# Patient Record
Sex: Female | Born: 1965 | Race: White | Hispanic: No | Smoking: Never smoker
Health system: Southern US, Community
[De-identification: ages and names within clinical notes are randomized; demographics above are authoritative.]

## PROBLEM LIST (undated history)

## (undated) DIAGNOSIS — F329 Major depressive disorder, single episode, unspecified: Secondary | ICD-10-CM

## (undated) DIAGNOSIS — F419 Anxiety disorder, unspecified: Secondary | ICD-10-CM

## (undated) DIAGNOSIS — R739 Hyperglycemia, unspecified: Secondary | ICD-10-CM

## (undated) DIAGNOSIS — R319 Hematuria, unspecified: Secondary | ICD-10-CM

## (undated) DIAGNOSIS — E119 Type 2 diabetes mellitus without complications: Secondary | ICD-10-CM

## (undated) DIAGNOSIS — L719 Rosacea, unspecified: Secondary | ICD-10-CM

## (undated) DIAGNOSIS — E559 Vitamin D deficiency, unspecified: Secondary | ICD-10-CM

## (undated) DIAGNOSIS — F32A Depression, unspecified: Secondary | ICD-10-CM

## (undated) DIAGNOSIS — F418 Other specified anxiety disorders: Secondary | ICD-10-CM

## (undated) DIAGNOSIS — E669 Obesity, unspecified: Secondary | ICD-10-CM

## (undated) DIAGNOSIS — E782 Mixed hyperlipidemia: Secondary | ICD-10-CM

## (undated) DIAGNOSIS — E538 Deficiency of other specified B group vitamins: Secondary | ICD-10-CM

## (undated) HISTORY — DX: Deficiency of other specified B group vitamins: E53.8

## (undated) HISTORY — DX: Depression, unspecified: F32.A

## (undated) HISTORY — DX: Anxiety disorder, unspecified: F41.9

## (undated) HISTORY — DX: Vitamin D deficiency, unspecified: E55.9

## (undated) HISTORY — PX: WISDOM TOOTH EXTRACTION: SHX21

## (undated) HISTORY — DX: Hyperglycemia, unspecified: R73.9

## (undated) HISTORY — DX: Rosacea, unspecified: L71.9

## (undated) HISTORY — DX: Hematuria, unspecified: R31.9

## (undated) HISTORY — DX: Mixed hyperlipidemia: E78.2

## (undated) HISTORY — DX: Obesity, unspecified: E66.9

## (undated) HISTORY — DX: Other specified anxiety disorders: F41.8

## (undated) HISTORY — DX: Major depressive disorder, single episode, unspecified: F32.9

---

## 1997-08-10 ENCOUNTER — Other Ambulatory Visit: Admission: RE | Admit: 1997-08-10 | Discharge: 1997-08-10 | Payer: Self-pay | Admitting: Gynecology

## 1997-09-04 ENCOUNTER — Other Ambulatory Visit: Admission: RE | Admit: 1997-09-04 | Discharge: 1997-09-04 | Payer: Self-pay | Admitting: Gynecology

## 1998-02-05 ENCOUNTER — Encounter: Admission: RE | Admit: 1998-02-05 | Discharge: 1998-05-06 | Payer: Self-pay | Admitting: Gynecology

## 1998-04-02 ENCOUNTER — Inpatient Hospital Stay (HOSPITAL_COMMUNITY): Admission: AD | Admit: 1998-04-02 | Discharge: 1998-04-05 | Payer: Self-pay | Admitting: Gynecology

## 1998-05-08 ENCOUNTER — Other Ambulatory Visit: Admission: RE | Admit: 1998-05-08 | Discharge: 1998-05-08 | Payer: Self-pay | Admitting: Obstetrics and Gynecology

## 1999-05-27 ENCOUNTER — Other Ambulatory Visit: Admission: RE | Admit: 1999-05-27 | Discharge: 1999-05-27 | Payer: Self-pay | Admitting: Obstetrics and Gynecology

## 2000-06-01 ENCOUNTER — Other Ambulatory Visit: Admission: RE | Admit: 2000-06-01 | Discharge: 2000-06-01 | Payer: Self-pay | Admitting: Gynecology

## 2001-06-01 ENCOUNTER — Other Ambulatory Visit: Admission: RE | Admit: 2001-06-01 | Discharge: 2001-06-01 | Payer: Self-pay | Admitting: Gynecology

## 2002-06-02 ENCOUNTER — Other Ambulatory Visit: Admission: RE | Admit: 2002-06-02 | Discharge: 2002-06-02 | Payer: Self-pay | Admitting: Gynecology

## 2003-09-17 ENCOUNTER — Other Ambulatory Visit: Admission: RE | Admit: 2003-09-17 | Discharge: 2003-09-17 | Payer: Self-pay | Admitting: Gynecology

## 2004-11-13 ENCOUNTER — Other Ambulatory Visit: Admission: RE | Admit: 2004-11-13 | Discharge: 2004-11-13 | Payer: Self-pay | Admitting: Gynecology

## 2005-12-10 ENCOUNTER — Other Ambulatory Visit: Admission: RE | Admit: 2005-12-10 | Discharge: 2005-12-10 | Payer: Self-pay | Admitting: Gynecology

## 2008-02-21 ENCOUNTER — Other Ambulatory Visit: Admission: RE | Admit: 2008-02-21 | Discharge: 2008-02-21 | Payer: Self-pay | Admitting: Obstetrics and Gynecology

## 2011-01-29 HISTORY — PX: DENTAL SURGERY: SHX609

## 2011-04-30 LAB — HM PAP SMEAR: HM Pap smear: NEGATIVE

## 2011-09-16 ENCOUNTER — Ambulatory Visit (INDEPENDENT_AMBULATORY_CARE_PROVIDER_SITE_OTHER): Payer: 59 | Admitting: Family Medicine

## 2011-09-16 VITALS — BP 125/80 | HR 108 | Temp 99.1°F | Resp 16 | Ht 67.5 in | Wt 195.0 lb

## 2011-09-16 DIAGNOSIS — W5503XA Scratched by cat, initial encounter: Secondary | ICD-10-CM

## 2011-09-16 DIAGNOSIS — Z23 Encounter for immunization: Secondary | ICD-10-CM

## 2011-09-16 DIAGNOSIS — J029 Acute pharyngitis, unspecified: Secondary | ICD-10-CM

## 2011-09-16 LAB — POCT RAPID STREP A (OFFICE): Rapid Strep A Screen: POSITIVE — AB

## 2011-09-16 MED ORDER — AMOXICILLIN-POT CLAVULANATE 875-125 MG PO TABS: 1.0000 | ORAL_TABLET | Freq: Two times a day (BID) | ORAL | Status: AC

## 2011-09-16 NOTE — Progress Notes (Signed)
Patient Name: REANA CHACKO Date of Birth: 1965/11/04 Medical Record Number: [id number] Gender: female Date of Encounter: 09/16/2011  History of Present Illness:  COZETTA SEIF is a 46 y.o. very pleasant female patient who presents with the following:  She has been ill for about 5 days.  Her illness started with a ST- this seemed to get better and she then had some cough.  Then over the last few days the cough got better but her ST got even worse.  She has not noted a fever at home.  Tylenol is not helping.  No aches or fatigue except for normal fatigue.    She also was scratched by a cat over the weekend- she then developed some redness and tenderness at the scratch sites.  They seem hot as well.  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  Her rabies shot had expired and she was going to take her to get her updated shot.  She was putting her into the carrier when the car got loose and scratched her arm.  This incident occurred on Saturday 6/15. She is really not sure when the animal's last rabies shot was.   otherwise generally healthy, LMP yesterday  Unsure last tetanus.   There is no problem list on file for this patient.  No past medical history on file. No past surgical history on file. History  Substance Use Topics  . Smoking status: Never Smoker   . Smokeless tobacco: Not on file  . Alcohol Use: Not on file   No family history on file. No Known Allergies  Medication list has been reviewed and updated.  Prior to Admission medications   Medication Sig Start Date End Date Taking? Authorizing Provider  Norethindrone Acetate-Ethinyl Estradiol (LOESTRIN 1.5/30, 21,) 1.5-30 MG-MCG tablet Take 1 tablet by mouth daily.   Yes Historical Provider, MD    Review of Systems:  As per HPI- otherwise negative.   Physical Examination: Filed Vitals:   09/16/11 1446  BP: 125/80  Pulse: 108  Temp: 99.1 F (37.3 C)  Resp: 16   Filed Vitals:   09/16/11 1446  Height:  5' 7.5" (1.715 m)  Weight: 195 lb (88.451 kg)   Body mass index is 30.09 kg/(m^2). Ideal Body Weight: Weight in (lb) to have BMI = 25: 161.7   GEN: WDWN, NAD, Non-toxic, A & O x 3 HEENT: Atraumatic, Normocephalic. Neck supple. No masses, No LAD.  PEERL, TM wnl, oropharynx injected but no exudate Ears and Nose: No external deformity. CV: RRR, No M/G/R. No JVD. No thrill. No extra heart sounds. PULM: CTA B, no wheezes, crackles, rhonchi. No retractions. No resp. distress. No accessory muscle use. EXTR: No c/c/e NEURO Normal gait.  PSYCH: Normally interactive. Conversant. Not depressed or anxious appearing.  Calm demeanor.  Right forearm:  3 small wounds which are warm and slightly swollen/ red- appear to be getting infected.  Normal function of hand  Results for orders placed in visit on 09/16/11  POCT RAPID STREP A (OFFICE)      Component Value Range   Rapid Strep A Screen Positive (*) Negative    Assessment and Plan: 1. Sore throat  POCT rapid strep A, amoxicillin-clavulanate (AUGMENTIN) 875-125 MG per tablet  2. Need for diphtheria-tetanus-pertussis (Tdap) vaccine, adult/adolescent  Tdap vaccine greater than or equal to 7yo IM  3. Cat scratch     Treat for infected cat wound and strep with augmentin.  Called CDC and then infectious disease  control in [place], Database administrator. He recommended going ahead with potential bite report- as rabies status is unknown the animal needs to be observed. It can be hard to tell a bite from a scratch in this situation.  While we understand that Minha does not her cat to be taken away and observed at animal control unnecessarily, we need to act in her best interests and be sure there is no rabies risk.  Watch for worsening infection closely, no work for 24 hours, let me know if throat not better in one or two days    Zyriah Mask, MD

## 2011-11-26 LAB — HM MAMMOGRAPHY: HM Mammogram: NEGATIVE

## 2012-10-18 ENCOUNTER — Telehealth: Payer: Self-pay | Admitting: Nurse Practitioner

## 2012-10-18 NOTE — Telephone Encounter (Signed)
Since there is no generic for LO Loestrin , I wonder if this is the one she did the best on - not the generic Loestsrin.  I would rather give her 3 months of samples to take and calm down the menorrhagia and dysmenorrhea then report back as to how she did on this.  Can you call and get samples for her.

## 2012-10-18 NOTE — Telephone Encounter (Signed)
Spoke with pt about samples of lo loestrin to regulate her bleeding. Pt to try for 3 months and call back with update. Lot # [id number] A. exp date 11-2013. 3 boxes given. Pt to come by tomorrow and pick up at front desk.

## 2012-10-18 NOTE — Telephone Encounter (Signed)
Patient call in to say that the Lo-lo-Estrin (generic) is not working as well as the name brand. Please advise patient. Still having cramps.

## 2012-10-18 NOTE — Telephone Encounter (Signed)
Spoke with pt about symptoms. Pt was in bed with bad cramping Sunday morning. Pt was having dry heaves and could hardly keep anything down, but she took ibuprofen with good relief. Started period yesterday. Having clots yesterday and today. Pt feels she did better on the brand name samples PG gave her than she is doing now on the generic. Pt reports her period was lighter and she did not have the cramps on the brand drug. Pt really does not want to pay $75 a month for the brand drug. Pt wondering if she should wait another month and see if it will improve with next period? Please advise.

## 2012-12-28 ENCOUNTER — Telehealth: Payer: Self-pay | Admitting: Nurse Practitioner

## 2012-12-28 NOTE — Telephone Encounter (Signed)
Pt called to request refill of the Loloestrin sent to [facility] on [address]. Their number is [phone].

## 2012-12-29 NOTE — Telephone Encounter (Signed)
Spoke with patient. She would like to stay on the loLoestrin despite cost.  She has savings cards she would like to use.  Routing refill request to Lauro Franklin, FNP

## 2013-01-02 ENCOUNTER — Encounter: Payer: Self-pay | Admitting: Family Medicine

## 2013-01-02 ENCOUNTER — Telehealth: Payer: Self-pay | Admitting: Emergency Medicine

## 2013-01-02 ENCOUNTER — Ambulatory Visit (INDEPENDENT_AMBULATORY_CARE_PROVIDER_SITE_OTHER): Payer: PRIVATE HEALTH INSURANCE | Admitting: Family Medicine

## 2013-01-02 VITALS — BP 127/74 | HR 95 | Temp 99.0°F | Resp 18 | Ht 67.0 in | Wt 203.6 lb

## 2013-01-02 DIAGNOSIS — F329 Major depressive disorder, single episode, unspecified: Secondary | ICD-10-CM

## 2013-01-02 DIAGNOSIS — F32A Depression, unspecified: Secondary | ICD-10-CM

## 2013-01-02 DIAGNOSIS — Z0000 Encounter for general adult medical examination without abnormal findings: Secondary | ICD-10-CM

## 2013-01-02 LAB — POCT URINALYSIS DIPSTICK
Bilirubin, UA: NEGATIVE
Glucose, UA: NEGATIVE
Ketones, UA: NEGATIVE
Nitrite, UA: NEGATIVE
Protein, UA: NEGATIVE
Spec Grav, UA: 1.02
Urobilinogen, UA: 0.2
pH, UA: 5.5

## 2013-01-02 LAB — COMPREHENSIVE METABOLIC PANEL
ALT: 15 U/L (ref 0–35)
AST: 14 U/L (ref 0–37)
Albumin: 4.2 g/dL (ref 3.5–5.2)
Alkaline Phosphatase: 61 U/L (ref 39–117)
BUN: 12 mg/dL (ref 6–23)
CO2: 24 mEq/L (ref 19–32)
Calcium: 8.9 mg/dL (ref 8.4–10.5)
Chloride: 104 mEq/L (ref 96–112)
Creat: 0.67 mg/dL (ref 0.50–1.10)
Glucose, Bld: 98 mg/dL (ref 70–99)
Potassium: 4.7 mEq/L (ref 3.5–5.3)
Sodium: 135 mEq/L (ref 135–145)
Total Bilirubin: 0.5 mg/dL (ref 0.3–1.2)
Total Protein: 6.9 g/dL (ref 6.0–8.3)

## 2013-01-02 LAB — LIPID PANEL
Cholesterol: 162 mg/dL (ref 0–200)
HDL: 52 mg/dL (ref >39)
LDL Cholesterol: 91 mg/dL (ref 0–99)
Total CHOL/HDL Ratio: 3.1 Ratio
Triglycerides: 96 mg/dL (ref <150)
VLDL: 19 mg/dL (ref 0–40)

## 2013-01-02 LAB — CBC WITH DIFFERENTIAL/PLATELET
Basophils Absolute: 0 10*3/uL (ref 0.0–0.1)
Basophils Relative: 0 % (ref 0–1)
Eosinophils Absolute: 0.2 10*3/uL (ref 0.0–0.7)
Eosinophils Relative: 3 % (ref 0–5)
HCT: 41.2 % (ref 36.0–46.0)
Hemoglobin: 13.9 g/dL (ref 12.0–15.0)
Lymphocytes Relative: 22 % (ref 12–46)
Lymphs Abs: 1.6 10*3/uL (ref 0.7–4.0)
MCH: 29.3 pg (ref 26.0–34.0)
MCHC: 33.7 g/dL (ref 30.0–36.0)
MCV: 86.9 fL (ref 78.0–100.0)
Monocytes Absolute: 0.4 10*3/uL (ref 0.1–1.0)
Monocytes Relative: 5 % (ref 3–12)
Neutro Abs: 5.1 10*3/uL (ref 1.7–7.7)
Neutrophils Relative %: 70 % (ref 43–77)
Platelets: 442 10*3/uL — ABNORMAL HIGH (ref 150–400)
RBC: 4.74 MIL/uL (ref 3.87–5.11)
RDW: 13.3 % (ref 11.5–15.5)
WBC: 7.4 10*3/uL (ref 4.0–10.5)

## 2013-01-02 LAB — POCT UA - MICROSCOPIC ONLY
Casts, Ur, LPF, POC: NEGATIVE
Crystals, Ur, HPF, POC: NEGATIVE
Mucus, UA: NEGATIVE
Yeast, UA: NEGATIVE

## 2013-01-02 LAB — TSH: TSH: 3.085 u[IU]/mL (ref 0.350–4.500)

## 2013-01-02 LAB — HEMOGLOBIN A1C
Hgb A1c MFr Bld: 6.2 % — ABNORMAL HIGH (ref <5.7)
Mean Plasma Glucose: 131 mg/dL — ABNORMAL HIGH (ref <117)

## 2013-01-02 LAB — VITAMIN B12: Vitamin B-12: 290 pg/mL (ref 211–911)

## 2013-01-02 MED ORDER — NORETHIN-ETH ESTRAD-FE BIPHAS 1 MG-10 MCG / 10 MCG PO TABS: 1.0000 | ORAL_TABLET | Freq: Every day | ORAL | Status: DC

## 2013-01-02 MED ORDER — BUPROPION HCL ER (XL) 150 MG PO TB24: 150.0000 mg | ORAL_TABLET | Freq: Every day | ORAL | Status: DC

## 2013-01-02 MED ORDER — SERTRALINE HCL 50 MG PO TABS: 50.0000 mg | ORAL_TABLET | Freq: Every day | ORAL | Status: DC

## 2013-01-02 NOTE — Telephone Encounter (Signed)
Encounter created in Error

## 2013-01-02 NOTE — Telephone Encounter (Signed)
Medication ordered and patient notified.  

## 2013-01-02 NOTE — Telephone Encounter (Signed)
Ok with me if she stays on Lo Loestrin instead of Loestrin - can have a refill through 05/2013

## 2013-01-02 NOTE — Progress Notes (Signed)
Subjective:    Patient ID: Cynthia Walters, female    DOB: 1965/12/31, 47 y.o.   MRN: [id number]  HPI Last CPE- 2010 Pap- 2/14, normal, Dr. Sable Feil- 7/13, normal Colonoscopy- never Tdap-08/2011 Flu- will get at work Eye exam- 4 years ago; uses reading glasses Dentist- every 6 months; no issues  PMH- Depression- has been treated in the past with medication; had trouble sleeping- took Ambien for 1 year. Felt dependent. Took Remeron- had 40 pound weight gain. Has tried Wellbutrin, Zoloft (doesnt' recall side effects), Prozac, adderall. Wellbutrin worked for [employer] but couldn't sleep. Hasn't had any meds for depression since 10/11. Open to taking Wellbutrin/Zoloft.  2006- weighed 170 pounds. Was having marital problems. Exercised and lost 30 pounds. 2011- Weighed 140 pounds.  Was in depression study 2012. Was told she needed medication.  Menorrhagia- takes LoLoestrin FE  Rosasea- no meds  FH- Mother- 17, alive, underactive thyroid Father- 62, alive, depression- no medication Brothers x 2- ?depression (no breast or colon cancer in family)  SH- Divorced, lives with 83 year old son and her boyfriend. Dating for 5 years. No abuse. Accountant. At same firm for 17 years. Feels like depression gets in the way. No tobacco. Beer/wine occasionally Not currently exercising Wears seatbelt Uses sunscreen when outside  Depression- withdrawn, wants to stay in bed, doesn't have contact with family and friends. Denies suicidal ideation.   Review of Systems Hearing ok, no ringing in ears, no headaches, no blurred vision/double vision No mouth sores that won't heal. No FH seizures. No neck swelling. Right lymph node swollen last week, gone now.  Some seasonal allergies. No CP, no palpitations, no SOB, no cough +snores, doesn't know if she quits breathing during night. Awakens feeling fatigued. Falls asleep easily in the afternoon on the weekends; very tired at work in the  afternoon. Falls asleep easily, sometimes awakens at 3-4 am and has difficulty falling back to sleep.Sleeps about 7 hours. No neck pain, no shoulder pain, no numbness or tingling in arms or legs. +back pain with sleeping some times. +small amt. Swelling Bowel movements regular, no bloody/black stools, nocturia x 1, no heart burn.     Past Medical History  Diagnosis Date  . Anxiety   . Depression   . Rosacea    Past Surgical History  Procedure Laterality Date  . Cesarean section     No Known Allergies Current Outpatient Prescriptions on File Prior to Visit  Medication Sig Dispense Refill  . Norethindrone-Ethinyl Estradiol-Fe Biphas (LO LOESTRIN FE) 1 MG-10 MCG / 10 MCG tablet Take 1 tablet by mouth daily.  1 Package  6  . Norethindrone Acetate-Ethinyl Estradiol (LOESTRIN 1.5/30, 21,) 1.5-30 MG-MCG tablet Take 1 tablet by mouth daily.       No current facility-administered medications on file prior to visit.   History   Social History  . Marital Status: Single    Spouse Name: N/A    Number of Children: 1  . Years of Education: N/A   Occupational History  . accountant    Social History Main Topics  . Smoking status: Never Smoker   . Smokeless tobacco: Not on file  . Alcohol Use: 1.2 oz/week    2 Glasses of wine per week  . Drug Use: No  . Sexual Activity: Yes    Birth Control/ Protection: Pill   Other Topics Concern  . Not on file   Social History Narrative   Marital status:  Divorced, Dating x 5 years; no abuse.  Children: one son (42 yo)      lives with:   53 year old son and her boyfriend.      Employment:  Airline pilot. At same firm for 17 years. Feels like depression gets in the way.      Tobacco:  No tobacco.      Alcohol:  Beer/wine occasionally      Exercise:  Not currently exercising   Family History  Problem Relation Age of Onset  . Thyroid disease Mother   . Depression Father   . Heart disease Maternal Grandmother   . Stroke Paternal Grandmother    . Stroke Paternal Grandfather     Objective:   Physical Exam  Nursing note and vitals reviewed. Constitutional: She is oriented to person, place, and time. She appears well-developed and well-nourished. No distress.  HENT:  Head: Normocephalic and atraumatic.  Right Ear: External ear normal.  Left Ear: External ear normal.  Nose: Nose normal.  Mouth/Throat: Oropharynx is clear and moist.  Eyes: Conjunctivae and EOM are normal. Pupils are equal, round, and reactive to light.  Neck: Normal range of motion and full passive range of motion without pain. Neck supple. No JVD present. Carotid bruit is not present. No thyromegaly present.  Cardiovascular: Normal rate, regular rhythm and normal heart sounds.  Exam reveals no gallop and no friction rub.   No murmur heard. Pulmonary/Chest: Effort normal and breath sounds normal. She has no wheezes. She has no rales.  Abdominal: Soft. Bowel sounds are normal. She exhibits no distension and no mass. There is no tenderness. There is no rebound and no guarding.  Musculoskeletal:       Right shoulder: Normal.       Left shoulder: Normal.       Cervical back: Normal.  Lymphadenopathy:    She has no cervical adenopathy.  Neurological: She is alert and oriented to person, place, and time. She has normal reflexes. No cranial nerve deficit. She exhibits normal muscle tone. Coordination normal.  Skin: Skin is warm and dry. No rash noted. She is not diaphoretic. No erythema. No pallor.  Psychiatric: She has a normal mood and affect. Her behavior is normal. Judgment and thought content normal.       Assessment & Plan:  Routine general medical examination at a health care facility - Plan: CBC with Differential, Comprehensive metabolic panel, Hemoglobin A1c, Lipid panel, TSH, Vitamin B12, Vit D  25 hydroxy (rtn osteoporosis monitoring), POCT urinalysis dipstick, POCT UA - Microscopic Only  Depression   1. CPE: anticipatory guidance --- weight loss,  exercise.  Pap smear and mammogram per gynecology.  No previous colonoscopy.  Immunizations UTD. Obtain labs. 2. Depression:uncontrolled; rx for Zoloft 50mg  daily and Wellbutrin XL 150mg  daily.  Follow-up in 2-3 months.  Meds ordered this encounter  Medications  . sertraline (ZOLOFT) 50 MG tablet    Sig: Take 1 tablet (50 mg total) by mouth daily.    Dispense:  30 tablet    Refill:  5  . DISCONTD: buPROPion (WELLBUTRIN XL) 150 MG 24 hr tablet    Sig: Take 1 tablet (150 mg total) by mouth daily.    Dispense:  30 tablet    Refill:  5   Nilda Simmer, M.D.  [facility] [address] [place], [place]  [address] [phone] phone [phone] fax

## 2013-01-03 LAB — VITAMIN D 25 HYDROXY (VIT D DEFICIENCY, FRACTURES): Vit D, 25-Hydroxy: 31 ng/mL (ref 30–89)

## 2013-02-01 ENCOUNTER — Encounter: Payer: Self-pay | Admitting: Family Medicine

## 2013-02-02 ENCOUNTER — Other Ambulatory Visit: Payer: Self-pay

## 2013-02-06 MED ORDER — ESCITALOPRAM OXALATE 10 MG PO TABS: 10.0000 mg | ORAL_TABLET | Freq: Every day | ORAL | Status: DC

## 2013-02-28 ENCOUNTER — Encounter: Payer: Self-pay | Admitting: Family Medicine

## 2013-03-07 ENCOUNTER — Ambulatory Visit: Payer: PRIVATE HEALTH INSURANCE | Admitting: Family Medicine

## 2013-03-15 ENCOUNTER — Encounter: Payer: Self-pay | Admitting: Family Medicine

## 2013-03-15 ENCOUNTER — Telehealth: Payer: Self-pay | Admitting: Family Medicine

## 2013-03-15 ENCOUNTER — Ambulatory Visit (INDEPENDENT_AMBULATORY_CARE_PROVIDER_SITE_OTHER): Payer: PRIVATE HEALTH INSURANCE | Admitting: Family Medicine

## 2013-03-15 VITALS — BP 125/73 | HR 97 | Temp 98.4°F | Resp 16 | Ht 67.0 in | Wt 202.0 lb

## 2013-03-15 DIAGNOSIS — F341 Dysthymic disorder: Secondary | ICD-10-CM

## 2013-03-15 DIAGNOSIS — G5600 Carpal tunnel syndrome, unspecified upper limb: Secondary | ICD-10-CM

## 2013-03-15 DIAGNOSIS — R7309 Other abnormal glucose: Secondary | ICD-10-CM

## 2013-03-15 DIAGNOSIS — F32A Depression, unspecified: Secondary | ICD-10-CM

## 2013-03-15 DIAGNOSIS — F329 Major depressive disorder, single episode, unspecified: Secondary | ICD-10-CM

## 2013-03-15 DIAGNOSIS — G4700 Insomnia, unspecified: Secondary | ICD-10-CM

## 2013-03-15 DIAGNOSIS — R7302 Impaired glucose tolerance (oral): Secondary | ICD-10-CM

## 2013-03-15 DIAGNOSIS — G5602 Carpal tunnel syndrome, left upper limb: Secondary | ICD-10-CM

## 2013-03-15 MED ORDER — BUPROPION HCL ER (XL) 300 MG PO TB24: 300.0000 mg | ORAL_TABLET | Freq: Every day | ORAL | Status: DC

## 2013-03-15 NOTE — Patient Instructions (Signed)

## 2013-03-15 NOTE — Telephone Encounter (Signed)
Called patient to see if she could come into appt. Center at 3:30 because we had a patient call out

## 2013-03-15 NOTE — Progress Notes (Signed)
Subjective:    Patient ID: Cynthia Walters, female    DOB: May 14, 1965, 47 y.o.   MRN: [id number]  HPI This 47 y.o. female presents for two month follow-up of the following:  1. Depression: at visit 01/02/13, rx for Zoloft 50mg  and Wellbutrin XL 150mg  daily provided.  Pt suffered from side effects from Zoloft so was switched to Lexapro 10mg  last week.   Zquil qhs.  Waking up at 3 or 4am and unable to return to sleep. Wellbutrin XL 150mg  q am.   Lexapro 10mg  q am. Mondays suffer with bad hypersomnolence.  Chronic issue.  Snores; no apnea.  Naps on weekends. Emotionally feel better;  had neighborhood cookout, did not dread attending event. Heidi was walking with patient for a while; none in two weeks.  45 minutes per session.    2.  Glucose Intolerance:  Normal fasting sugars but recent HgbA1c of 6.2.    3. L hand numbness:  Thumb and radiates to elbow.   No neck pain.  No weakness.     Review of Systems  Constitutional: Positive for fatigue. Negative for chills, diaphoresis, activity change and appetite change.  Endocrine: Negative for polydipsia, polyphagia and polyuria.  Musculoskeletal: Positive for arthralgias. Negative for back pain, joint swelling, neck pain and neck stiffness.  Neurological: Positive for numbness.  Psychiatric/Behavioral: Positive for sleep disturbance and dysphoric mood. Negative for suicidal ideas and self-injury. The patient is nervous/anxious.        Objective:   Physical Exam  Constitutional: She is oriented to person, place, and time. She appears well-developed and well-nourished. No distress.  HENT:  Head: Normocephalic and atraumatic.  Right Ear: External ear normal.  Left Ear: External ear normal.  Nose: Nose normal.  Mouth/Throat: Oropharynx is clear and moist.  Eyes: Conjunctivae and EOM are normal. Pupils are equal, round, and reactive to light.  Neck: Normal range of motion. Neck supple. Carotid bruit is not present. No thyromegaly present.    Cardiovascular: Normal rate, regular rhythm, normal heart sounds and intact distal pulses.  Exam reveals no gallop and no friction rub.   No murmur heard. Pulmonary/Chest: Effort normal and breath sounds normal. She has no wheezes. She has no rales.  Abdominal: Soft. Bowel sounds are normal. She exhibits no distension and no mass. There is no tenderness. There is no rebound and no guarding.  Musculoskeletal:       Right shoulder: Normal.       Left shoulder: Normal.       Right elbow: Normal.      Left elbow: Normal.       Right wrist: Normal.       Left wrist: Normal.       Cervical back: Normal. She exhibits normal range of motion.  Lymphadenopathy:    She has no cervical adenopathy.  Neurological: She is alert and oriented to person, place, and time. No cranial nerve deficit.  Skin: Skin is warm and dry. No rash noted. She is not diaphoretic. No erythema. No pallor.  Psychiatric: She has a normal mood and affect. Her behavior is normal.       Assessment & Plan:  Anxiety and depression  Insomnia  Glucose intolerance (impaired glucose tolerance)  Carpal tunnel syndrome, left  1. Anxiety with depression: slightly improved; increase Wellbutrin to 300mg  daily due to hypersomnolence.  Continue Lexapro 10mg  daily.   2.  Insomnia: uncontrolled; treat anxiety and depression appropriately and expect insomnia to improve; recommend regular exercise and caffeine  avoidance after lunch. 3.  Glucose intolerance: New. Recommend weight loss, exercise, low-carbohydrate food choices. 4.  Carpal Tunnel syndrome L: New. Recommend wrist splint qhs; recommend scheduled NSAID for two weeks and then PRN. Consider B6 supplement.  Meds ordered this encounter  Medications  . DISCONTD: buPROPion (WELLBUTRIN XL) 300 MG 24 hr tablet    Sig: Take 1 tablet (300 mg total) by mouth daily.    Dispense:  30 tablet    Refill:  5   Nilda Simmer, M.D.  [facility] [address] [place], [place]  [address] [phone] phone [phone] fax

## 2013-05-31 ENCOUNTER — Encounter: Payer: Self-pay | Admitting: Nurse Practitioner

## 2013-06-01 ENCOUNTER — Ambulatory Visit: Payer: 59 | Admitting: Nurse Practitioner

## 2013-06-08 ENCOUNTER — Encounter: Payer: Self-pay | Admitting: Family Medicine

## 2013-06-27 ENCOUNTER — Ambulatory Visit (INDEPENDENT_AMBULATORY_CARE_PROVIDER_SITE_OTHER): Payer: PRIVATE HEALTH INSURANCE | Admitting: Nurse Practitioner

## 2013-06-27 ENCOUNTER — Encounter: Payer: Self-pay | Admitting: Nurse Practitioner

## 2013-06-27 VITALS — BP 130/76 | HR 88 | Ht 67.25 in | Wt 203.0 lb

## 2013-06-27 DIAGNOSIS — Z01419 Encounter for gynecological examination (general) (routine) without abnormal findings: Secondary | ICD-10-CM

## 2013-06-27 MED ORDER — NORETHIN-ETH ESTRAD-FE BIPHAS 1 MG-10 MCG / 10 MCG PO TABS: 1.0000 | ORAL_TABLET | Freq: Every day | ORAL | Status: DC

## 2013-06-27 NOTE — Patient Instructions (Signed)

## 2013-06-27 NOTE — Progress Notes (Signed)
Patient ID: Cynthia Walters, female   DOB: [birth year], 48 y.o.   MRN: [id number] 48 y.o. G1P1001 Single Caucasian Fe here for annual exam.  Still on Lo Loestrin and doing well.  Menses at 1-2 days of light spotting or no menses. (On Loestrin her menses was still heavy).  Maybe slight cramps. Same partner for 6 years.  She doing well on Lexapro and Wellbutrin since August.  There have been an increase in vaso symptoms since starting on Lexapro.  She will discuss this with PCP.   Patient's last menstrual period was 06/26/2013.          Sexually active: yes  The current method of family planning is vasectomy and OCP (estrogen/progesterone).    Exercising: no  The patient does not participate in regular exercise at present. Smoker:  no  Health Maintenance: Pap:  04/30/11, WNL, neg HR HPV MMG:  11/26/11, Bi-Rads 1: negative TDaP:  08/2011 Labs:  HB: declined, PCP Urine:  Declined, PCP   reports that she has never smoked. She has never used smokeless tobacco. She reports that she drinks about 2.4 ounces of alcohol per week. She reports that she does not use illicit drugs.  Past Medical History  Diagnosis Date  . Anxiety   . Depression   . Rosacea     Past Surgical History  Procedure Laterality Date  . Cesarean section  2000  . Wisdom tooth extraction  age 54    Current Outpatient Prescriptions  Medication Sig Dispense Refill  . buPROPion (WELLBUTRIN XL) 300 MG 24 hr tablet Take 1 tablet (300 mg total) by mouth daily.  30 tablet  5  . escitalopram (LEXAPRO) 10 MG tablet Take 1 tablet (10 mg total) by mouth daily.  30 tablet  5  . Norethindrone-Ethinyl Estradiol-Fe Biphas (LO LOESTRIN FE) 1 MG-10 MCG / 10 MCG tablet Take 1 tablet by mouth daily.  3 Package  3   No current facility-administered medications for this visit.    Family History  Problem Relation Age of Onset  . Thyroid disease Mother   . Depression Father   . Heart disease Maternal Grandmother   . Stroke Paternal  Grandmother   . Stroke Paternal Grandfather   . Depression Brother   . Depression Brother     ROS:  Pertinent items are noted in HPI.  Otherwise, a comprehensive ROS was negative.  Exam:   BP 130/76  Pulse 88  Ht 5' 7.25" (1.708 m)  Wt 203 lb (92.08 kg)  BMI 31.56 kg/m2  LMP 06/26/2013 Height: 5' 7.25" (170.8 cm)  Ht Readings from Last 3 Encounters:  06/27/13 5' 7.25" (1.708 m)  03/15/13 5\' 7"  (1.702 m)  01/02/13 5\' 7"  (1.702 m)    General appearance: alert, cooperative and appears stated age Head: Normocephalic, without obvious abnormality, atraumatic Neck: no adenopathy, supple, symmetrical, trachea midline and thyroid normal to inspection and palpation Lungs: clear to auscultation bilaterally Breasts: normal appearance, no masses or tenderness Heart: regular rate and rhythm Abdomen: soft, non-tender; no masses,  no organomegaly Extremities: extremities normal, atraumatic, no cyanosis or edema Skin: Skin color, texture, turgor normal. No rashes or lesions Lymph nodes: Cervical, supraclavicular, and axillary nodes normal. No abnormal inguinal nodes palpated Neurologic: Grossly normal   Pelvic: External genitalia:  no lesions              Urethra:  normal appearing urethra with no masses, tenderness or lesions  Bartholin's and Skene's: normal                 Vagina: normal appearing vagina with normal color and discharge, no lesions              Cervix: anteverted              Pap taken: no Bimanual Exam:  Uterus:  normal size, contour, position, consistency, mobility, non-tender              Adnexa: no mass, fullness, tenderness               Rectovaginal: Confirms               Anus:  normal sphincter tone, no lesions  A:  Well Woman with normal exam  Vasectomy for birth control  OCP for menorrhagia  P:   Pap smear as per guidelines   Mammogram is due now and will schedule  Refill Lo Loestrin for a year  Counseled on breast self exam, mammography  screening, use and side effects of OCP's, adequate intake of calcium and vitamin D, diet and exercise return annually or prn  An After Visit Summary was printed and given to the patient.

## 2013-06-27 NOTE — Progress Notes (Signed)
Reviewed personally.  M. Suzanne Benay Pomeroy, MD.  

## 2013-06-28 ENCOUNTER — Encounter: Payer: Self-pay | Admitting: Family Medicine

## 2013-08-25 ENCOUNTER — Other Ambulatory Visit: Payer: Self-pay | Admitting: Family Medicine

## 2013-09-14 ENCOUNTER — Telehealth: Payer: Self-pay

## 2013-09-14 NOTE — Telephone Encounter (Signed)
PATIENT NEEDS A REFILL OF Escitalopram Oxalate (Tab) LEXAPRO 10 MG, PHARMACY STATES PATIENT HAS NO REFILLS LEFT AND THEY HAVE TRIED TO CONTACT THE OFFICE AND HAVE NOT HEARD ANYTHING BACK.  [facility]  [address]  [place]

## 2013-09-14 NOTE — Telephone Encounter (Signed)
Called pharm, we have not gotten any recent reqs for RF but on 08/25/13 got a req and sent in 1 mos RF w/note pt needs ov for more. Pharmacist did locate that RF on file and will get ready for pt. Called pt to notify of this and pt agreed to f/up.

## 2013-09-29 ENCOUNTER — Other Ambulatory Visit: Payer: Self-pay | Admitting: Family Medicine

## 2013-09-29 NOTE — Telephone Encounter (Signed)
Checked w/pt as to current dose of wellbutrin she is taking d/t discussion through [facility] w/Dr Tamala Julian on 06/28/13. Pt stated she is still taking the 300mg  of wellbutrin but is in process of trying to get appt w/Dr Tamala Julian. Transferred pt to 104 to change appt date and will send in 1 mos RF.

## 2013-10-13 ENCOUNTER — Other Ambulatory Visit: Payer: Self-pay | Admitting: Physician Assistant

## 2013-10-16 ENCOUNTER — Ambulatory Visit (INDEPENDENT_AMBULATORY_CARE_PROVIDER_SITE_OTHER): Payer: PRIVATE HEALTH INSURANCE | Admitting: Family Medicine

## 2013-10-16 ENCOUNTER — Encounter: Payer: Self-pay | Admitting: Family Medicine

## 2013-10-16 VITALS — BP 119/70 | HR 86 | Temp 98.0°F | Resp 16 | Ht 67.5 in | Wt 205.2 lb

## 2013-10-16 DIAGNOSIS — F418 Other specified anxiety disorders: Secondary | ICD-10-CM

## 2013-10-16 DIAGNOSIS — E669 Obesity, unspecified: Secondary | ICD-10-CM

## 2013-10-16 DIAGNOSIS — G4710 Hypersomnia, unspecified: Secondary | ICD-10-CM

## 2013-10-16 DIAGNOSIS — E538 Deficiency of other specified B group vitamins: Secondary | ICD-10-CM

## 2013-10-16 DIAGNOSIS — R7309 Other abnormal glucose: Secondary | ICD-10-CM

## 2013-10-16 DIAGNOSIS — F341 Dysthymic disorder: Secondary | ICD-10-CM

## 2013-10-16 LAB — POCT GLYCOSYLATED HEMOGLOBIN (HGB A1C): Hemoglobin A1C: 5.6

## 2013-10-16 MED ORDER — ESCITALOPRAM OXALATE 20 MG PO TABS: 20.0000 mg | ORAL_TABLET | Freq: Every day | ORAL | Status: DC

## 2013-10-16 MED ORDER — BUPROPION HCL ER (SR) 150 MG PO TB12: 150.0000 mg | ORAL_TABLET | Freq: Two times a day (BID) | ORAL | Status: DC

## 2013-10-16 NOTE — Progress Notes (Signed)
Subjective:    Patient ID: Cynthia Walters, female    DOB: 06/26/1965, 48 y.o.   MRN: [id number]  10/16/2013  Follow-up and Depression   HPI This 48 y.o. female presents for seven month follow-up:  1. Anxiety and depression: last visit to office in 02/2013, increased Wellbutrin to 300mg  daily and continued Lexapro 10mg  daily.  Patient emailed provider on 10/04/13 complaining of excessive sweating; recommended decreasing Wellbutrin to qod. Works in Systems developer at work.  Really overwhelmed and burned out at work.  Suffers with excessive fatigue; now napping on weekends both days.  Getting plenty of sleep during the week.  Bedtime 11:30-6:30.  Will sleep late on weekends and still needs a nap.  Unable to get housework done.  Hates Mondays; hates job she thinks.  Good boss; not involved.  No pressure from boss.  Sits at desk all day.  Not exercising when gets home.  Suffering with excessive sweating.  Current employment x 17 years.  Company has grown so more work; reports directly to [employer]; president is planning to hire another person to help pt.  Still isolating self.  Less anxious.  Not a type A personality.  Very laid back.  Father is bipolar but refuses medication.  Last psychiatry consultation in the past years ago 2007.  Not happy with results; took Remeron in past which caused horrible weight gain.  Interested in therapy referral.  Denies periods of elevated mood; did feel really great when lost weight a few years ago; this was the closest to mania that patient had felt but was not severe and not recurrent.  2.  Glucose Intolerance: detected at CPE 12/2012; HgbA1c 6.2; due for repeat labs; weight unchanged.  Has not changed diet.  Not exercising. B:  Whole wheat toast, egg; drinks diet soft drink. Snack:  None Lunch:  Fast food; sandwich or cafeteria. Snack:  None Dinner/supper: varies.  Eating more salads.  3. Vitamin B12 deficiency: borderline level in 12/2012;  recommended starting MVI.  Patient non-compliant with starting MVI.   Review of Systems  Constitutional: Positive for fatigue. Negative for activity change and appetite change.  Respiratory: Negative for shortness of breath.   Cardiovascular: Negative for chest pain, palpitations and leg swelling.  Endocrine: Negative for cold intolerance, heat intolerance, polydipsia, polyphagia and polyuria.  Psychiatric/Behavioral: Positive for dysphoric mood. Negative for suicidal ideas, sleep disturbance, self-injury and decreased concentration. The patient is not nervous/anxious.     Past Medical History  Diagnosis Date  . Anxiety   . Depression   . Rosacea    Past Surgical History  Procedure Laterality Date  . Cesarean section  2000  . Wisdom tooth extraction  age 59   No Known Allergies Current Outpatient Prescriptions  Medication Sig Dispense Refill  . escitalopram (LEXAPRO) 20 MG tablet Take 1 tablet (20 mg total) by mouth daily.  30 tablet  5  . Norethindrone-Ethinyl Estradiol-Fe Biphas (LO LOESTRIN FE) 1 MG-10 MCG / 10 MCG tablet Take 1 tablet by mouth daily.  3 Package  3  . buPROPion (WELLBUTRIN SR) 150 MG 12 hr tablet Take 1 tablet (150 mg total) by mouth 2 (two) times daily.  60 tablet  5   No current facility-administered medications for this visit.   History   Social History  . Marital Status: Single    Spouse Name: N/A    Number of Children: 1  . Years of Education: N/A   Occupational History  . accountant  Social History Main Topics  . Smoking status: Never Smoker   . Smokeless tobacco: Never Used  . Alcohol Use: 2.4 oz/week    4 Glasses of wine per week  . Drug Use: No  . Sexual Activity: Yes    Birth Control/ Protection: Pill, Surgical     Comment: vasectomy   Other Topics Concern  . Not on file   Social History Narrative   Marital status:  Divorced, Dating x 5 years; no abuse.      Children: one son (46 yo)      lives with:   32 year old son and her  boyfriend.      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      Tobacco:  No tobacco.      Alcohol:  Beer/wine occasionally      Exercise:  Not currently exercising   Family History  Problem Relation Age of Onset  . Thyroid disease Mother   . Depression Father   . Mental illness Father     Bipolar  . Heart disease Maternal Grandmother   . Stroke Paternal Grandmother   . Stroke Paternal Grandfather   . Depression Brother   . Depression Brother        Objective:    BP 119/70  Pulse 86  Temp(Src) 98 F (36.7 C) (Oral)  Resp 16  Ht 5' 7.5" (1.715 m)  Wt 205 lb 3.2 oz (93.078 kg)  BMI 31.65 kg/m2  SpO2 98%  LMP 09/25/2013 Physical Exam  Nursing note and vitals reviewed. Constitutional: She is oriented to person, place, and time. She appears well-developed and well-nourished. No distress.  HENT:  Head: Normocephalic and atraumatic.  Eyes: Conjunctivae are normal. Pupils are equal, round, and reactive to light.  Neck: Normal range of motion. Neck supple.  Cardiovascular: Normal rate, regular rhythm and normal heart sounds.  Exam reveals no gallop and no friction rub.   No murmur heard. Pulmonary/Chest: Effort normal and breath sounds normal. She has no wheezes. She has no rales.  Neurological: She is alert and oriented to person, place, and time.  Skin: She is not diaphoretic.  Psychiatric: She has a normal mood and affect. Her behavior is normal. Judgment and thought content normal.   Results for orders placed in visit on 10/16/13  VITAMIN B12      Result Value Ref Range   Vitamin B-12 240  211 - 911 pg/mL  POCT GLYCOSYLATED HEMOGLOBIN (HGB A1C)      Result Value Ref Range   Hemoglobin A1C 5.6         Assessment & Plan:   1. Other abnormal glucose   2. Vitamin B12 deficiency   3. Hypersomnolence   4. Obesity, unspecified   5. Depression with anxiety    1.  Glucose intolerance: improved.  Continue with dietary  modification; recommend exercise and weight loss. 2.  Vitamin B12 deficiency: persistent; recommend starting MVI or B complex daily especially with recent worsening fatigue. 3.  Hypersomnolence:  Worsening; associated with obesity; refer for sleep study.   4.  Obesity: persistent; highly encourage weight loss and regular exercise. 5.  Depression with anxiety: persistent; anxiety has improved but patient still suffering with depressive symptoms; increase Lexapro to 20mg  daily; change Wellbutrin to SR 150mg  q am. Follow-up in three months.  If mood remains low with adjustments of medications and with psychotherapy, consider referral to psychiatry. Sweating has improved with decreased  dose of Wellbutrin.  Meds ordered this encounter  Medications  . escitalopram (LEXAPRO) 20 MG tablet    Sig: Take 1 tablet (20 mg total) by mouth daily.    Dispense:  30 tablet    Refill:  5  . buPROPion (WELLBUTRIN SR) 150 MG 12 hr tablet    Sig: Take 1 tablet (150 mg total) by mouth 2 (two) times daily.    Dispense:  60 tablet    Refill:  5    Return in about 3 months (around 01/16/2014) for recheck.    Reginia Forts, M.D.  [facility] [address] [place], [place]  [address] [phone] phone [phone] fax

## 2013-10-17 ENCOUNTER — Encounter: Payer: Self-pay | Admitting: Family Medicine

## 2013-10-17 LAB — VITAMIN B12: Vitamin B-12: 240 pg/mL (ref 211–911)

## 2013-10-20 ENCOUNTER — Encounter: Payer: Self-pay | Admitting: Family Medicine

## 2013-11-08 ENCOUNTER — Encounter: Payer: Self-pay | Admitting: Neurology

## 2013-11-08 ENCOUNTER — Ambulatory Visit (INDEPENDENT_AMBULATORY_CARE_PROVIDER_SITE_OTHER): Payer: PRIVATE HEALTH INSURANCE | Admitting: Neurology

## 2013-11-08 VITALS — BP 102/66 | HR 86 | Temp 98.8°F | Ht 68.0 in | Wt 206.0 lb

## 2013-11-08 DIAGNOSIS — R400 Somnolence: Secondary | ICD-10-CM

## 2013-11-08 DIAGNOSIS — G4710 Hypersomnia, unspecified: Secondary | ICD-10-CM

## 2013-11-08 DIAGNOSIS — R0989 Other specified symptoms and signs involving the circulatory and respiratory systems: Secondary | ICD-10-CM

## 2013-11-08 DIAGNOSIS — R351 Nocturia: Secondary | ICD-10-CM

## 2013-11-08 DIAGNOSIS — G478 Other sleep disorders: Secondary | ICD-10-CM

## 2013-11-08 DIAGNOSIS — R0609 Other forms of dyspnea: Secondary | ICD-10-CM

## 2013-11-08 DIAGNOSIS — R0683 Snoring: Secondary | ICD-10-CM

## 2013-11-08 NOTE — Patient Instructions (Signed)

## 2013-11-08 NOTE — Progress Notes (Signed)
Subjective:    Patient ID: TYSHEKA FANGUY is a 48 y.o. female.  HPI    Star Age, MD, PhD [facility] [address] [address] [place], [place] [address]  Dear [name],  I saw your patient, Vonne Mcdanel, upon your kind request in my neurologic clinic today for initial consultation of her sleep disturbance, in particular, concern for underlying obstructive sleep apnea. The patient is unaccompanied today. As you know, Ms. Owensby is a 48 year old right-handed woman with an underlying medical history of B12 deficiency, obesity, depression, anxiety and rosacea, who reports excessive daytime somnolence and snoring. She denies AM HAs.   Her typical bedtime is reported to be around 11 PM and usual wake time is around 6:30 AM. Sleep onset typically occurs within 20 minutes. She reports feeling poorly rested upon awakening. She wakes up on an average 1 times in the middle of the night and has to go to the bathroom 1 times on a typical night.  She reports excessive daytime somnolence (EDS) and Her Epworth Sleepiness Score (ESS) is 8/24 today. She has not fallen asleep while driving. The patient has not been taking a scheduled nap, but when she naps she feels better. She has at times utilized her lunch break to take a quick nap because she felt she could not function otherwise. 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 On the WE she may sleep up to 10 hours, but does not necessarily wake up rested.  She has been known to snore for the past many years. Snoring is reportedly marked, but unclear if associated with choking sounds and witnessed apneas. The patient reports no significant sense of choking or strangling feeling. There is no report of nighttime reflux, with no nighttime cough experienced. The patient has not noted any RLS symptoms and is not known to kick while asleep or before falling asleep. There is family history of OSA.   She denies cataplexy, sleep  paralysis, hypnagogic or hypnopompic hallucinations, or sleep attacks. She does not report any vivid dreams, nightmares, dream enactments, or parasomnias, such as sleep talking or sleep walking. The patient has not had a sleep study or a home sleep test.  She drinks one soda and one iced tea per day.   Her bedroom is usually dark and cool. There is a TV in the bedroom and usually it is on a timer. She has a cat in the bed as well.   Her Past Medical History Is Significant For: Past Medical History  Diagnosis Date  . Anxiety   . Depression   . Rosacea     Her Past Surgical History Is Significant For: Past Surgical History  Procedure Laterality Date  . Cesarean section  2000  . Wisdom tooth extraction  age 89  . Dental surgery  01/2011    tooth removed,implant    Her Family History Is Significant For: Family History  Problem Relation Age of Onset  . Thyroid disease Mother   . Depression Father   . Mental illness Father     Bipolar  . Heart disease Maternal Grandmother   . Stroke Paternal Grandmother   . Stroke Paternal Grandfather   . Depression Brother   . Depression Brother     Her Social History Is Significant For: History   Social History  . Marital Status: Single    Spouse Name: N/A    Number of Children: 1  . Years of Education: 5   Occupational  History  . accountant    Social History Main Topics  . Smoking status: Never Smoker   . Smokeless tobacco: Never Used  . Alcohol Use: 2.4 oz/week    4 Glasses of wine per week     Comment: occassional wine with dinner  . Drug Use: No  . Sexual Activity: Yes    Birth Control/ Protection: Pill, Surgical     Comment: vasectomy   Other Topics Concern  . None   Social History Narrative   Marital status:  Divorced, Dating x 5 years; no abuse.      Children: one son (5 yo)      lives with:   80 year old son and her boyfriend.      Employment:  Optometrist. At same firm for 17 years. Feels like depression gets in  the way.      Tobacco:  No tobacco.      Alcohol:  Beer/wine occasionally      Exercise:  Not currently exercising    Her Allergies Are:  No Known Allergies:   Her Current Medications Are:  Outpatient Encounter Prescriptions as of 11/08/2013  Medication Sig  . buPROPion (WELLBUTRIN SR) 150 MG 12 hr tablet Take 1 tablet (150 mg total) by mouth 2 (two) times daily.  Marland Kitchen escitalopram (LEXAPRO) 20 MG tablet Take 1 tablet (20 mg total) by mouth daily.  . Norethindrone-Ethinyl Estradiol-Fe Biphas (LO LOESTRIN FE) 1 MG-10 MCG / 10 MCG tablet Take 1 tablet by mouth daily.  :  Review of Systems:  Out of a complete 14 point review of systems, all are reviewed and negative with the exception of these symptoms as listed below:  Review of Systems  Constitutional: Positive for fatigue.  Respiratory:       Snoring  Endocrine: Positive for heat intolerance.       Flushing,feeling hot  Psychiatric/Behavioral: Positive for sleep disturbance.       Depression,sleepiness, snoring    Objective:  Neurologic Exam  Physical Exam Physical Examination:   Filed Vitals:   11/08/13 0947  BP: 102/66  Pulse: 86  Temp: 98.8 F (37.1 C)    General Examination: The patient is a very pleasant 48 y.o. female in no acute distress. She appears well-developed and well-nourished and well groomed. She is mildly anxious appearing.  She is obese.  HEENT: Normocephalic, atraumatic, pupils are equal, round and reactive to light and accommodation. Funduscopic exam is normal with sharp disc margins noted. Extraocular tracking is good without limitation to gaze excursion or nystagmus noted. Normal smooth pursuit is noted. Hearing is grossly intact. Tympanic membranes are clear bilaterally. Face is symmetric with normal facial animation and normal facial sensation. Speech is clear with no dysarthria noted. There is no hypophonia. There is no lip, neck/head, jaw or voice tremor. Neck is supple with full range of passive and  active motion. There are no carotid bruits on auscultation. Oropharynx exam reveals: mild mouth dryness, adequate dental hygiene and moderate airway crowding, due to narrow airway entry, redundant soft palate and tonsils. Mallampati is class II. Tongue protrudes centrally and palate elevates symmetrically. Tonsils are 2+ in size. Neck size is 15.75 inches. She has a Moderate overbite. Nasal inspection reveals no significant nasal mucosal bogginess or redness but she has a septal deviation to the R.   Chest: Clear to auscultation without wheezing, rhonchi or crackles noted.  Heart: S1+S2+0, regular and normal without murmurs, rubs or gallops noted.   Abdomen: Soft, non-tender and non-distended with  normal bowel sounds appreciated on auscultation.  Extremities: There is no pitting edema in the distal lower extremities bilaterally. Pedal pulses are intact.  Skin: Warm and dry without trophic changes noted. There are no varicose veins.  Musculoskeletal: exam reveals no obvious joint deformities, tenderness or joint swelling or erythema.   Neurologically:  Mental status: The patient is awake, alert and oriented in all 4 spheres. Her immediate and remote memory, attention, language skills and fund of knowledge are appropriate. There is no evidence of aphasia, agnosia, apraxia or anomia. Speech is clear with normal prosody and enunciation. Thought process is linear. Mood is normal and affect is normal.  Cranial nerves II - XII are as described above under HEENT exam. In addition: shoulder shrug is normal with equal shoulder height noted. Motor exam: Normal bulk, strength and tone is noted. There is no drift, tremor or rebound. Romberg is negative. Reflexes are 2+ throughout. Babinski: Toes are flexor bilaterally. Fine motor skills and coordination: intact with normal finger taps, normal hand movements, normal rapid alternating patting, normal foot taps and normal foot agility.  Cerebellar testing: No  dysmetria or intention tremor on finger to nose testing. Heel to shin is unremarkable bilaterally. There is no truncal or gait ataxia.  Sensory exam: intact to light touch, pinprick, vibration, temperature sense in the upper and lower extremities.  Gait, station and balance: She stands easily. No veering to one side is noted. No leaning to one side is noted. Posture is age-appropriate and stance is narrow based. Gait shows normal stride length and normal pace. No problems turning are noted. She turns en bloc. Tandem walk is unremarkable. Intact toe and heel stance is noted.               Assessment and Plan:   In summary, TORRIN FREIN is a very pleasant 48 y.o.-year old female with a history and physical exam concerning for obstructive sleep apnea (OSA), given her history of nonrestorative sleep, loud snoring reported, nocturia, and significant daytime somnolence reported as well as a family history of OSA and her obese status.  I had a long chat with the patient about my findings and the diagnosis of OSA, its prognosis and treatment options. We talked about medical treatments, surgical interventions and non-pharmacological approaches. I explained in particular the risks and ramifications of untreated moderate to severe OSA, especially with respect to developing cardiovascular disease down the Road, including congestive heart failure, difficult to treat hypertension, cardiac arrhythmias, or stroke. Even type 2 diabetes has, in part, been linked to untreated OSA. Symptoms of untreated OSA include daytime sleepiness, memory problems, mood irritability and mood disorder such as depression and anxiety, lack of energy, as well as recurrent headaches, especially morning headaches. We talked about trying to maintain a healthy lifestyle in general, as well as the importance of weight control. I encouraged the patient to eat healthy, exercise daily and keep well hydrated, to keep a scheduled bedtime and wake time  routine, to not skip any meals and eat healthy snacks in between meals. I advised the patient not to drive when feeling sleepy. I recommended the following at this time: sleep study with potential positive airway pressure titration.  I explained the sleep test procedure to the patient and also outlined possible surgical and non-surgical treatment options of OSA, including the use of a custom-made dental device (which would require a referral to a specialist dentist or oral surgeon), upper airway surgical options, such as pillar implants, radiofrequency surgery,  tongue base surgery, and UPPP (which would involve a referral to an ENT surgeon). Rarely, jaw surgery such as mandibular advancement may be considered.  I also explained the CPAP treatment option to the patient, who indicated that she  would be willing to try CPAP if the need arises. I explained the importance of being compliant with PAP treatment, not only for insurance purposes but primarily to improve Her symptoms, and for the patient's long term health benefit, including to reduce Her cardiovascular risks. I answered all her  questions today and the patient was in agreement. I would like to see her  back after the sleep study is completed and encouraged her  to call with any interim questions, concerns, problems or updates.   Thank you very much for allowing me to participate in the care of this nice patient. If I can be of any further assistance to you please do not hesitate to call me at [phone].  Sincerely,   Star Age, MD, PhD

## 2013-11-17 ENCOUNTER — Encounter: Payer: Self-pay | Admitting: Family Medicine

## 2014-01-01 ENCOUNTER — Encounter: Payer: Self-pay | Admitting: Family Medicine

## 2014-01-01 DIAGNOSIS — Z0000 Encounter for general adult medical examination without abnormal findings: Secondary | ICD-10-CM

## 2014-01-01 DIAGNOSIS — R7302 Impaired glucose tolerance (oral): Secondary | ICD-10-CM

## 2014-01-01 DIAGNOSIS — R5382 Chronic fatigue, unspecified: Secondary | ICD-10-CM

## 2014-01-01 DIAGNOSIS — F32A Depression, unspecified: Secondary | ICD-10-CM

## 2014-01-01 DIAGNOSIS — E785 Hyperlipidemia, unspecified: Secondary | ICD-10-CM

## 2014-01-01 DIAGNOSIS — F329 Major depressive disorder, single episode, unspecified: Secondary | ICD-10-CM

## 2014-01-08 ENCOUNTER — Other Ambulatory Visit: Payer: Self-pay | Admitting: Urgent Care

## 2014-01-08 ENCOUNTER — Encounter: Payer: Self-pay | Admitting: Family Medicine

## 2014-01-08 ENCOUNTER — Ambulatory Visit (INDEPENDENT_AMBULATORY_CARE_PROVIDER_SITE_OTHER): Payer: PRIVATE HEALTH INSURANCE | Admitting: Family Medicine

## 2014-01-08 VITALS — BP 132/75 | HR 102 | Temp 100.0°F | Resp 16 | Ht 67.5 in | Wt 207.0 lb

## 2014-01-08 DIAGNOSIS — F329 Major depressive disorder, single episode, unspecified: Secondary | ICD-10-CM

## 2014-01-08 DIAGNOSIS — F418 Other specified anxiety disorders: Secondary | ICD-10-CM

## 2014-01-08 DIAGNOSIS — R5382 Chronic fatigue, unspecified: Secondary | ICD-10-CM

## 2014-01-08 DIAGNOSIS — E538 Deficiency of other specified B group vitamins: Secondary | ICD-10-CM

## 2014-01-08 DIAGNOSIS — R7302 Impaired glucose tolerance (oral): Secondary | ICD-10-CM

## 2014-01-08 DIAGNOSIS — E785 Hyperlipidemia, unspecified: Secondary | ICD-10-CM

## 2014-01-08 DIAGNOSIS — F32A Depression, unspecified: Secondary | ICD-10-CM

## 2014-01-08 HISTORY — DX: Other specified anxiety disorders: F41.8

## 2014-01-08 LAB — CBC
HCT: 40 % (ref 36.0–46.0)
Hemoglobin: 13.7 g/dL (ref 12.0–15.0)
MCH: 29.4 pg (ref 26.0–34.0)
MCHC: 34.3 g/dL (ref 30.0–36.0)
MCV: 85.8 fL (ref 78.0–100.0)
Platelets: 441 10*3/uL — ABNORMAL HIGH (ref 150–400)
RBC: 4.66 MIL/uL (ref 3.87–5.11)
RDW: 13.5 % (ref 11.5–15.5)
WBC: 7.6 10*3/uL (ref 4.0–10.5)

## 2014-01-08 LAB — COMPREHENSIVE METABOLIC PANEL
ALT: 14 U/L (ref 0–35)
AST: 15 U/L (ref 0–37)
Albumin: 4.1 g/dL (ref 3.5–5.2)
Alkaline Phosphatase: 62 U/L (ref 39–117)
BUN: 13 mg/dL (ref 6–23)
CO2: 22 mEq/L (ref 19–32)
Calcium: 8.9 mg/dL (ref 8.4–10.5)
Chloride: 104 mEq/L (ref 96–112)
Creat: 0.74 mg/dL (ref 0.50–1.10)
Glucose, Bld: 105 mg/dL — ABNORMAL HIGH (ref 70–99)
Potassium: 4.7 mEq/L (ref 3.5–5.3)
Sodium: 136 mEq/L (ref 135–145)
Total Bilirubin: 0.5 mg/dL (ref 0.2–1.2)
Total Protein: 6.6 g/dL (ref 6.0–8.3)

## 2014-01-08 LAB — LIPID PANEL
Cholesterol: 152 mg/dL (ref 0–200)
HDL: 56 mg/dL (ref >39)
LDL Cholesterol: 77 mg/dL (ref 0–99)
Total CHOL/HDL Ratio: 2.7 Ratio
Triglycerides: 96 mg/dL (ref <150)
VLDL: 19 mg/dL (ref 0–40)

## 2014-01-08 LAB — HEMOGLOBIN A1C
Hgb A1c MFr Bld: 6 % — ABNORMAL HIGH (ref <5.7)
Mean Plasma Glucose: 126 mg/dL — ABNORMAL HIGH (ref <117)

## 2014-01-08 LAB — IRON: Iron: 120 ug/dL (ref 42–145)

## 2014-01-08 MED ORDER — FLUOXETINE HCL 20 MG PO TABS: 20.0000 mg | ORAL_TABLET | Freq: Every day | ORAL | Status: DC

## 2014-01-08 NOTE — Progress Notes (Signed)
Subjective:    Patient ID: Cynthia Walters, female    DOB: [birth year], 48 y.o.   MRN: [id number]  HPI  Cynthia Walters is presenting for her annual physical exam.  Depression - managed with Wellbutrin and Lexapro. Patient has been tried on multiple classes of antidepressants. Today, reports that she's "doing okay". Her biggest concerns are fatigue, sleep, obesity and continued sweating episodes. Overall she continues to feel tired and sleepy. On average, she gets 11-12 hours on the weekends, ~7 hours on weekdays due to work. She was referred to be evaluated for sleep study. Unfortunately, it will not be covered by insurance. Subsequently, patient went to dentist to get fitted for mouth guards and just got them last Thursday (01/04/2014). Feels like she slept better over the weekend and hopes that this will help with her persistent fatigue and hypersomnia. Regarding her weight, patient admits that her diet is inconsistent, sometimes healthy, eats out a lot including fast food because of demand of work hours. She also does not exercise regularly, maybe once a week admitting that both work and motivation are an issue here. Cynthia Walters also reports ongoing intermittent sweating episodes although it's better than previous times when she was on a higher dose of Wellbutrin. Other associated symptoms including feeling isolated and lack of energy. Of note, patient works in Press [employer] for a sizeable company and is under a lot of stress from the demands of her job. This has been an ongoing problem but fortunately, she expects that they will be hiring another employee in the near future to help with her workload. Patient also admits having at least 1 glass of wine or 1 Corona Light every day. Does not currently smoke.  Borderline Vitamin B12 as per previous labs (09/2013) - reports Vitamin B-12 supplements since her last visit. Does not notice a difference as she was asymptomatic previously without the supplement. Denies  neuropathy, glossitis, memory impairments. Denies being vegetarian and eats plenty of meat. Denies any other aggravating or relieving factors.   Prior to Admission medications   Medication Sig Start Date End Date Taking? Authorizing Provider  buPROPion (WELLBUTRIN SR) 150 MG 12 hr tablet Take 1 tablet (150 mg total) by mouth 2 (two) times daily. 10/16/13  Yes [name], MD  escitalopram (LEXAPRO) 20 MG tablet Take 1 tablet (20 mg total) by mouth daily. 10/16/13  Yes [name], MD  Norethindrone-Ethinyl Estradiol-Fe Biphas (LO LOESTRIN FE) 1 MG-10 MCG / 10 MCG tablet Take 1 tablet by mouth daily. 06/27/13  Yes Patricia Rolen-Grubb, FNP  FLUoxetine (PROZAC) 20 MG tablet Take 1 tablet (20 mg total) by mouth daily. 01/08/14   [name], MD    No Known Allergies   Review of Systems  Constitutional: Positive for fatigue. Negative for fever, chills, activity change and appetite change.  HENT: Negative for congestion, ear discharge, ear pain, sore throat, trouble swallowing and voice change.   Eyes: Negative for visual disturbance.  Respiratory: Negative for cough, chest tightness, shortness of breath and wheezing.   Cardiovascular: Negative for chest pain, palpitations and leg swelling.  Gastrointestinal: Negative for nausea, vomiting, abdominal pain, diarrhea, constipation and blood in stool.  Endocrine: Negative for polydipsia, polyphagia and polyuria.  Genitourinary: Negative for dysuria, flank pain and difficulty urinating.  Skin: Negative for rash.  Psychiatric/Behavioral: Positive for dysphoric mood. Negative for behavioral problems, confusion, sleep disturbance, decreased concentration and agitation. The patient is nervous/anxious (espcially with her work).  Objective:   Physical Exam  Constitutional: She is oriented to person, place, and time. She appears well-developed and well-nourished. No distress.  HENT:  Head: Normocephalic and atraumatic.  Right Ear: External  ear normal.  Left Ear: External ear normal.  Nose: Nose normal.  Mouth/Throat: Oropharynx is clear and moist. No oropharyngeal exudate.  Eyes: Conjunctivae and EOM are normal. Pupils are equal, round, and reactive to light. Right eye exhibits no discharge. Left eye exhibits no discharge. No scleral icterus.  Neck: Normal range of motion. Neck supple. No thyromegaly present.  Cardiovascular: Normal rate, regular rhythm, normal heart sounds and intact distal pulses.  Exam reveals no gallop and no friction rub.   No murmur heard. Pulmonary/Chest: Breath sounds normal. No respiratory distress. She has no wheezes. She has no rales.  Abdominal: Soft. Bowel sounds are normal. She exhibits no distension and no mass. There is no tenderness.  Genitourinary:  Patient declined. Followed by gynecologist.  Musculoskeletal: Normal range of motion. She exhibits no edema and no tenderness.  Lymphadenopathy:    She has no cervical adenopathy.  Neurological: She is alert and oriented to person, place, and time. She has normal reflexes.  Skin: Skin is warm and dry. No rash noted. She is not diaphoretic.  Psychiatric: Blunted affect, appears dysphoric, self-critical. Thoughts appropriate to content, behavioral normal, no suicidal/homicidal ideation.     Assessment & Plan:   1. Chronic fatigue 2. Depression Depression and fatigue persistent despite multiple classes of antidepressants at adequate doses, length of trials; previous labs do not reveal anemia, hypothyroidism; sleep study not done due to lack of insurance coverage, monitor progress s/p fitting for mouth guards; repeat labs today; wean off Lexapro, start trial of Prozac, continue Wellbutrin at current dose. - CBC - Comprehensive metabolic panel - Hemoglobin A1c - TSH - Vitamin B12 - Vit D  25 hydroxy (rtn osteoporosis monitoring) - Iron - T4, free  3. Glucose intolerance (impaired glucose tolerance) 4. Hyperlipidemia Stable, diet controlled,  repeat labs today - Hemoglobin A1c - TSH - Lipid panel  5. Vitamin B12 deficiency Stable, asymptomatic, repeat labs today   [name], PA-C [facility] [phone] 01/08/2014 8:58 PM

## 2014-01-08 NOTE — Patient Instructions (Signed)
1. Wean Lexapro to 1/2 tablet every other day until gone.   2. Start Prozac one tablet every morning tomorrow. 3.  Continue Wellbutrin at current dose.

## 2014-01-09 LAB — T4, FREE: Free T4: 0.93 ng/dL (ref 0.80–1.80)

## 2014-01-09 LAB — TSH: TSH: 2.902 u[IU]/mL (ref 0.350–4.500)

## 2014-01-09 LAB — VITAMIN D 25 HYDROXY (VIT D DEFICIENCY, FRACTURES): Vit D, 25-Hydroxy: 27 ng/mL — ABNORMAL LOW (ref 30–89)

## 2014-01-09 LAB — VITAMIN B12: Vitamin B-12: 466 pg/mL (ref 211–911)

## 2014-01-16 ENCOUNTER — Other Ambulatory Visit: Payer: Self-pay | Admitting: Family Medicine

## 2014-01-16 ENCOUNTER — Encounter: Payer: Self-pay | Admitting: Family Medicine

## 2014-01-16 DIAGNOSIS — R7302 Impaired glucose tolerance (oral): Secondary | ICD-10-CM

## 2014-01-16 DIAGNOSIS — E669 Obesity, unspecified: Secondary | ICD-10-CM

## 2014-01-23 NOTE — Progress Notes (Signed)
History and physical examinations obtained with [name], PA-C. Agree with assessment and plan.  Pt very resistant to undergo psychiatric consultation. Now agreeable to undergo therapy.  Patient has been referred on several occasions in past visits but has not made appointment.  Will wean lexapro and start prozac for desires of stimulating effects. If no improvement with Prozac and Wellbutrin, consider switching to Effexor but concern for sweating with SNRI.    Highly encourage exercise.

## 2014-01-29 ENCOUNTER — Encounter: Payer: Self-pay | Admitting: Family Medicine

## 2014-02-12 ENCOUNTER — Ambulatory Visit: Payer: Self-pay | Admitting: Skilled Nursing Facility1

## 2014-02-21 ENCOUNTER — Ambulatory Visit (INDEPENDENT_AMBULATORY_CARE_PROVIDER_SITE_OTHER): Payer: PRIVATE HEALTH INSURANCE | Admitting: Internal Medicine

## 2014-02-21 ENCOUNTER — Encounter: Payer: Self-pay | Admitting: Internal Medicine

## 2014-02-21 VITALS — BP 121/71 | HR 94 | Temp 98.4°F | Resp 16 | Ht 67.25 in | Wt 206.0 lb

## 2014-02-21 DIAGNOSIS — R319 Hematuria, unspecified: Secondary | ICD-10-CM

## 2014-02-21 DIAGNOSIS — Z23 Encounter for immunization: Secondary | ICD-10-CM

## 2014-02-21 DIAGNOSIS — N946 Dysmenorrhea, unspecified: Secondary | ICD-10-CM

## 2014-02-21 DIAGNOSIS — F411 Generalized anxiety disorder: Secondary | ICD-10-CM

## 2014-02-21 LAB — POCT URINALYSIS DIPSTICK
Bilirubin, UA: NEGATIVE
Glucose, UA: NEGATIVE
Ketones, UA: NEGATIVE
Leukocytes, UA: NEGATIVE
Nitrite, UA: NEGATIVE
Protein, UA: NEGATIVE
Spec Grav, UA: 1.01
Urobilinogen, UA: NEGATIVE
pH, UA: 6.5

## 2014-02-21 NOTE — Patient Instructions (Signed)
Give pt Dr. Caprice Beaver psychiatry office number    Schedule CPE   If pt does not have number to Nutrition give to her to call.  Already ordered prior to this visit   Increase Wellbutrin to 150 mg bid

## 2014-02-21 NOTE — Progress Notes (Signed)
Subjective:    Patient ID: Cynthia Walters, female    DOB: 06/09/1965, 48 y.o.   MRN: [id number]  HPI 01/08/2014  Family medicine note [name], MD at 01/23/2014 2:26 PM     Status: Signed       Expand All Collapse All   History and physical examinations obtained with [name], PA-C. Agree with assessment and plan. Pt very resistant to undergo psychiatric consultation. Now agreeable to undergo therapy. Patient has been referred on several occasions in past visits but has not made appointment. Will wean lexapro and start prozac for desires of stimulating effects. If no improvement with Prozac and Wellbutrin, consider switching to Effexor but concern for sweating with SNRI. Highly encourage exercise.             Therapy Notes     No notes of this type exist for this encounter.     Not recorded     Medications Ordered This Encounter       Disp Refills Start End    FLUoxetine (PROZAC) 20 MG tablet 90 tablet 1 01/08/2014     Take 1 tablet (20 mg total) by mouth daily. - Oral         Patient Instructions     1. Wean Lexapro to 1/2 tablet every other day until gone.  2. Start Prozac one tablet every morning tomorrow. 3. Continue Wellbutrin at current dose.       New pt here for first visit.    PMH of mixed anxiety/Depression ( pt states more depression),  Gestational diabetes.  Pt has several concerns today  .  She relates she is fatigued quite a lot.  She is concerned about prior lab tests and f she has diabetes , her salt level and if she has a UTI.    She notes she has gained weight and states she has lost weight in past when she could walk daily but she has not been able to exercise  Lots of marital stress in past  Divorced in 2006-2007.  She has been seeing [name] for control of mixed anxiety/depression.  She was instructed to take WEllbutrin BID but is only taking once a day due to finances.  She did see [name] in past but  has not seen since 2010.  Also has seen [name].  She is also only taking 1/2 of Lexapro 20 mg and was recenlty advised to stop this and start Prozac but has not done this.  See [name] note above.    FH  Father treated for BAD  I do see that she has been referred to a psychologist and to nutrition but pt has not kept those appts  No Known Allergies Past Medical History  Diagnosis Date  . Anxiety   . Depression   . Rosacea    Past Surgical History  Procedure Laterality Date  . Cesarean section  2000  . Wisdom tooth extraction  age 54  . Dental surgery  01/2011    tooth removed,implant   History   Social History  . Marital Status: Single    Spouse Name: N/A    Number of Children: 1  . Years of Education: 16   Occupational History  . accountant    Social History Main Topics  . Smoking status: Never Smoker   . Smokeless tobacco: Never Used  . Alcohol Use: 2.4 oz/week    4 Glasses of wine per week  Comment: occassional wine with dinner  . Drug Use: No  . Sexual Activity: Yes    Birth Control/ Protection: Pill, Surgical     Comment: vasectomy   Other Topics Concern  . Not on file   Social History Narrative   Marital status:  Divorced, Dating x 5 years; no abuse.      Children: one son (49 yo)      lives with:   72 year old son and her boyfriend.      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      Tobacco:  No tobacco.      Alcohol:  Beer/wine occasionally      Exercise:  Not currently exercising   Family History  Problem Relation Age of Onset  . Thyroid disease Mother   . Depression Father   . Mental illness Father     Bipolar  . Heart disease Maternal Grandmother   . Stroke Paternal Grandmother   . Stroke Paternal Grandfather   . Depression Brother   . Depression Brother    Patient Active Problem List   Diagnosis Date Noted  . Depression 01/08/2014  . Chronic fatigue 01/08/2014    Current Outpatient Prescriptions on File Prior to Visit  Medication Sig Dispense Refill  . buPROPion (WELLBUTRIN SR) 150 MG 12 hr tablet Take 1 tablet (150 mg total) by mouth 2 (two) times daily. 60 tablet 5  . escitalopram (LEXAPRO) 20 MG tablet Take 1 tablet (20 mg total) by mouth daily. 30 tablet 5  . FLUoxetine (PROZAC) 20 MG tablet Take 1 tablet (20 mg total) by mouth daily. 90 tablet 1  . Norethindrone-Ethinyl Estradiol-Fe Biphas (LO LOESTRIN FE) 1 MG-10 MCG / 10 MCG tablet Take 1 tablet by mouth daily. 3 Package 3   No current facility-administered medications on file prior to visit.        Review of Systems See HPI    Objective:   Physical Exam Physical Exam  Nursing note and vitals reviewed.  Constitutional: She is oriented to person, place, and time. She appears well-developed and well-nourished.  HENT:  Head: Normocephalic and atraumatic.  Cardiovascular: Normal rate and regular rhythm. Exam reveals no gallop and no friction rub.  No murmur heard.  Pulmonary/Chest: Breath sounds normal. She has no wheezes. She has no rales.  Neurological: She is alert and oriented to person, place, and time.  Skin: Skin is warm and dry.  Psychiatric: She has a normal mood and affect. Her behavior is normal.        Assessment & Plan:  Reviewed all labs with pt.  Assured her she is not diabetic  Encouraged her to reschedule her appt with nutritionist  Mixed/anxiety depression  :  Clinically suspect depression not at maximum control which likely explains fatigue.  I do think she would benefit from psychiatric diagnostic eval and TX.  I gave pt number to Dr. Caprice Beaver whose NP she has seen in the past and advised her to make appt.  Also advised to increase her WEllbutrin to 150 mg bid consistantly.  No change in Lexapro for now  Hematuria on chemstrip  willll send for CX Pt to call office for furhter instructions based on results  Dysmennorhea  On OC's per gyn provider

## 2014-02-23 LAB — URINE CULTURE
Colony Count: NO GROWTH
Organism ID, Bacteria: NO GROWTH

## 2014-02-25 DIAGNOSIS — N946 Dysmenorrhea, unspecified: Secondary | ICD-10-CM | POA: Insufficient documentation

## 2014-02-25 DIAGNOSIS — F411 Generalized anxiety disorder: Secondary | ICD-10-CM | POA: Insufficient documentation

## 2014-02-26 ENCOUNTER — Encounter: Payer: Self-pay | Admitting: Internal Medicine

## 2014-03-01 NOTE — Progress Notes (Signed)
I spoke with Cynthia Walters and set her up for a follow up in two weeks-eh

## 2014-03-12 ENCOUNTER — Encounter: Payer: Self-pay | Admitting: Internal Medicine

## 2014-03-12 ENCOUNTER — Ambulatory Visit (INDEPENDENT_AMBULATORY_CARE_PROVIDER_SITE_OTHER): Payer: PRIVATE HEALTH INSURANCE | Admitting: Internal Medicine

## 2014-03-12 VITALS — BP 117/66 | HR 95 | Resp 16 | Ht 67.0 in | Wt 207.0 lb

## 2014-03-12 DIAGNOSIS — R319 Hematuria, unspecified: Secondary | ICD-10-CM

## 2014-03-12 LAB — POCT URINALYSIS DIPSTICK
Bilirubin, UA: NEGATIVE
Ketones, UA: NEGATIVE
Leukocytes, UA: NEGATIVE
Nitrite, UA: NEGATIVE
Protein, UA: NEGATIVE
Spec Grav, UA: 1.01
Urobilinogen, UA: NEGATIVE
pH, UA: 6.5

## 2014-03-12 NOTE — Progress Notes (Signed)
Subjective:    Patient ID: Cynthia Walters, female    DOB: [birth year], 48 y.o.   MRN: [id number]  HPI  Cynthia Walters is here to follow up on chemstrip pos on dipstick  Culture is neg  But she is having some dysuria now.   No CVA pain  Brother has kidney stones   No FH of cancer   No fever    No Known Allergies Past Medical History  Diagnosis Date  . Anxiety   . Depression   . Rosacea    Past Surgical History  Procedure Laterality Date  . Cesarean section  2000  . Wisdom tooth extraction  age 31  . Dental surgery  01/2011    tooth removed,implant   History   Social History  . Marital Status: Divorced    Spouse Name: N/A    Number of Children: 1  . Years of Education: 16   Occupational History  . accountant    Social History Main Topics  . Smoking status: Never Smoker   . Smokeless tobacco: Never Used  . Alcohol Use: 2.4 oz/week    4 Glasses of wine per week     Comment: occassional wine with dinner  . Drug Use: No  . Sexual Activity:    Partners: Male    Birth Control/ Protection: Pill, Surgical     Comment: vasectomy   Other Topics Concern  . Not on file   Social History Narrative   Marital status:  Divorced, Dating x 5 years; no abuse.      Children: one son (57 yo)      lives with:   34 year old son and her boyfriend.      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      Tobacco:  No tobacco.      Alcohol:  Beer/wine occasionally      Exercise:  Not currently exercising   Family History  Problem Relation Age of Onset  . Thyroid disease Mother   . Depression Father   . Mental illness Father     Bipolar  . Heart disease Maternal Grandmother   . Stroke Paternal Grandmother   . Stroke Paternal Grandfather   . Depression Brother   . Depression Brother    Patient Active Problem List   Diagnosis Date Noted  . Anxiety state 02/25/2014  . Dysmenorrhea 02/25/2014  . Depression 01/08/2014  . Chronic  fatigue 01/08/2014   Current Outpatient Prescriptions on File Prior to Visit  Medication Sig Dispense Refill  . buPROPion (WELLBUTRIN SR) 150 MG 12 hr tablet Take 1 tablet (150 mg total) by mouth 2 (two) times daily. 60 tablet 5  . cholecalciferol (VITAMIN D) 1000 UNITS tablet Take 1,000 Units by mouth daily.    . Cyanocobalamin (VITAMIN B 12 PO) Take by mouth.    . escitalopram (LEXAPRO) 20 MG tablet Take 1 tablet (20 mg total) by mouth daily. 30 tablet 5  . Norethindrone-Ethinyl Estradiol-Fe Biphas (LO LOESTRIN FE) 1 MG-10 MCG / 10 MCG tablet Take 1 tablet by mouth daily. 3 Package 3   No current facility-administered medications on file prior to visit.      Review of Systems See HPI    Objective:   Physical Exam Physical Exam  Nursing note and vitals reviewed.  Constitutional: She is oriented to person, place, and time. She appears well-developed and well-nourished.  HENT:  Head: Normocephalic and atraumatic.  Cardiovascular:  Normal rate and regular rhythm. Exam reveals no gallop and no friction rub.  No murmur heard.  Pulmonary/Chest: Breath sounds normal. She has no wheezes. She has no rales.  Abd  No CVA tenderness Neurological: She is alert and oriented to person, place, and time.  Skin: Skin is warm and dry.  Psychiatric: She has a normal mood and affect. Her behavior is normal.        Assessment & Plan:  Microsopic hematuria :  Send for microscopy .   Will recheck in January .   If still present will need imaging and possible referral

## 2014-03-12 NOTE — Patient Instructions (Signed)
See me in January

## 2014-03-13 ENCOUNTER — Ambulatory Visit: Payer: PRIVATE HEALTH INSURANCE | Admitting: Internal Medicine

## 2014-03-13 LAB — URINALYSIS, MICROSCOPIC ONLY
Casts: NONE SEEN
Crystals: NONE SEEN

## 2014-03-13 LAB — URINE CULTURE: Colony Count: 30000

## 2014-03-14 ENCOUNTER — Telehealth: Payer: Self-pay | Admitting: *Deleted

## 2014-03-14 NOTE — Telephone Encounter (Signed)
Called pt to adv urine culture was normal. Pt expressed understanding.

## 2014-03-14 NOTE — Telephone Encounter (Signed)
-----   Message from Lanice Shirts, MD sent at 03/14/2014  7:55 AM EST ----- Call pt and let her know that no bacterial infection in her urine.     Advise to keep appt with me in January

## 2014-04-09 ENCOUNTER — Encounter: Payer: Self-pay | Admitting: Internal Medicine

## 2014-04-09 ENCOUNTER — Ambulatory Visit (INDEPENDENT_AMBULATORY_CARE_PROVIDER_SITE_OTHER): Payer: PRIVATE HEALTH INSURANCE | Admitting: Internal Medicine

## 2014-04-09 VITALS — BP 122/70 | HR 81 | Resp 16 | Ht 67.0 in | Wt 207.0 lb

## 2014-04-09 DIAGNOSIS — R319 Hematuria, unspecified: Secondary | ICD-10-CM

## 2014-04-09 LAB — BASIC METABOLIC PANEL
BUN: 16 mg/dL (ref 6–23)
CO2: 26 mEq/L (ref 19–32)
Calcium: 9.4 mg/dL (ref 8.4–10.5)
Chloride: 104 mEq/L (ref 96–112)
Creat: 0.77 mg/dL (ref 0.50–1.10)
Glucose, Bld: 178 mg/dL — ABNORMAL HIGH (ref 70–99)
Potassium: 4.8 mEq/L (ref 3.5–5.3)
Sodium: 138 mEq/L (ref 135–145)

## 2014-04-09 NOTE — Progress Notes (Signed)
Subjective:    Patient ID: Cynthia Walters, female    DOB: 04-23-1965, 49 y.o.   MRN: [id number]  HPI Cynthia Walters is here for follow up of microscopic hematuria  Microscopy showed  <3 cells  She does not have CVA  pain or dysuria or frequency   Brother has kidney stones  No Known Allergies Past Medical History  Diagnosis Date  . Anxiety   . Depression   . Rosacea    Past Surgical History  Procedure Laterality Date  . Cesarean section  2000  . Wisdom tooth extraction  age 31  . Dental surgery  01/2011    tooth removed,implant   History   Social History  . Marital Status: Divorced    Spouse Name: N/A    Number of Children: 1  . Years of Education: 16   Occupational History  . accountant    Social History Main Topics  . Smoking status: Never Smoker   . Smokeless tobacco: Never Used  . Alcohol Use: 2.4 oz/week    4 Glasses of wine per week     Comment: occassional wine with dinner  . Drug Use: No  . Sexual Activity:    Partners: Male    Birth Control/ Protection: Pill, Surgical     Comment: vasectomy   Other Topics Concern  . Not on file   Social History Narrative   Marital status:  Divorced, Dating x 5 years; no abuse.      Children: one son (58 yo)      lives with:   23 year old son and her boyfriend.      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      Tobacco:  No tobacco.      Alcohol:  Beer/wine occasionally      Exercise:  Not currently exercising   Family History  Problem Relation Age of Onset  . Thyroid disease Mother   . Depression Father   . Mental illness Father     Bipolar  . Heart disease Maternal Grandmother   . Stroke Paternal Grandmother   . Stroke Paternal Grandfather   . Depression Brother   . Depression Brother    Patient Active Problem List   Diagnosis Date Noted  . Anxiety state 02/25/2014  . Dysmenorrhea 02/25/2014  . Depression 01/08/2014  . Chronic fatigue 01/08/2014    Current Outpatient Prescriptions on File Prior to Visit  Medication Sig Dispense Refill  . buPROPion (WELLBUTRIN SR) 150 MG 12 hr tablet Take 1 tablet (150 mg total) by mouth 2 (two) times daily. 60 tablet 5  . cholecalciferol (VITAMIN D) 1000 UNITS tablet Take 1,000 Units by mouth daily.    . Cyanocobalamin (VITAMIN B 12 PO) Take by mouth.    . escitalopram (LEXAPRO) 20 MG tablet Take 1 tablet (20 mg total) by mouth daily. 30 tablet 5  . Norethindrone-Ethinyl Estradiol-Fe Biphas (LO LOESTRIN FE) 1 MG-10 MCG / 10 MCG tablet Take 1 tablet by mouth daily. 3 Package 3   No current facility-administered medications on file prior to visit.       Review of Systems    see HPI Objective:   Physical Exam Physical Exam  Nursing note and vitals reviewed.  Constitutional: She is oriented to person, place, and time. She appears well-developed and well-nourished.  HENT:  Head: Normocephalic and atraumatic.  Cardiovascular: Normal rate and regular rhythm. Exam reveals no gallop and no friction  rub.  No murmur heard.  Pulmonary/Chest: Breath sounds normal. She has no wheezes. She has no rales.  Abd:  No CVA pain   Bilaterally  Neurological: She is alert and oriented to person, place, and time.  Skin: Skin is warm and dry.  Psychiatric: She has a normal mood and affect. Her behavior is normal.       Assessment & Plan:  Microscopic hematuria  Will get CT. With and without Further management based on results

## 2014-04-09 NOTE — Patient Instructions (Signed)
To go to xray today to schedule   To lab today

## 2014-04-10 ENCOUNTER — Telehealth: Payer: Self-pay | Admitting: *Deleted

## 2014-04-10 NOTE — Telephone Encounter (Signed)
-----   Message from Lanice Shirts, MD sent at 04/10/2014  7:29 AM EST ----- Call pt and let her know that her blood sugar is higher than expected   Add an AIC to labs.  Have her see me in office next week - come in fasting so I can recheck  A fasting glucose.  Give her a 30 min appt   OK to mail labs to her

## 2014-04-10 NOTE — Telephone Encounter (Signed)
I spoke with patient ans set her up to come in fasting next week. We were unable to add on the A1c since a purple top test tube was not collected. We will colllect this at her next visit.

## 2014-04-11 ENCOUNTER — Ambulatory Visit (HOSPITAL_BASED_OUTPATIENT_CLINIC_OR_DEPARTMENT_OTHER): Payer: PRIVATE HEALTH INSURANCE

## 2014-04-16 ENCOUNTER — Telehealth: Payer: Self-pay | Admitting: Nurse Practitioner

## 2014-04-16 NOTE — Telephone Encounter (Signed)
Spoke with patient. Patient states that she went online to obtain rx savings card. Has activated the card and will take it with her to the pharmacy. Will call with any further questions or if she needs any help.  Routing to provider for final review. Patient agreeable to disposition. Will close encounter

## 2014-04-16 NOTE — Telephone Encounter (Signed)
Patient is asking if we have any Lo Lo estrin prescription discount cards. Patient last seen 06/27/2013.

## 2014-04-17 ENCOUNTER — Ambulatory Visit: Payer: PRIVATE HEALTH INSURANCE | Admitting: Internal Medicine

## 2014-04-22 NOTE — Progress Notes (Signed)
Subjective:    Patient ID: Cynthia Walters, female    DOB: 1965/11/07, 49 y.o.   MRN: [id number]  HPI Cynthia Walters is here for follow up of elevated random glucose of 178.  Pt reports "borderline gestational diabetes"  No increased thirst no polyuria or  Polyphagia.    FH pos in GM  No Known Allergies Past Medical History  Diagnosis Date  . Anxiety   . Depression   . Rosacea    Past Surgical History  Procedure Laterality Date  . Cesarean section  2000  . Wisdom tooth extraction  age 13  . Dental surgery  01/2011    tooth removed,implant   History   Social History  . Marital Status: Divorced    Spouse Name: N/A    Number of Children: 1  . Years of Education: 16   Occupational History  . accountant    Social History Main Topics  . Smoking status: Never Smoker   . Smokeless tobacco: Never Used  . Alcohol Use: 2.4 oz/week    4 Glasses of wine per week     Comment: occassional wine with dinner  . Drug Use: No  . Sexual Activity:    Partners: Male    Birth Control/ Protection: Pill, Surgical     Comment: vasectomy   Other Topics Concern  . Not on file   Social History Narrative   Marital status:  Divorced, Dating x 5 years; no abuse.      Children: one son (52 yo)      lives with:   9 year old son and her boyfriend.      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      Tobacco:  No tobacco.      Alcohol:  Beer/wine occasionally      Exercise:  Not currently exercising   Family History  Problem Relation Age of Onset  . Thyroid disease Mother   . Depression Father   . Mental illness Father     Bipolar  . Heart disease Maternal Grandmother   . Stroke Paternal Grandmother   . Stroke Paternal Grandfather   . Depression Brother   . Depression Brother    Patient Active Problem List   Diagnosis Date Noted  . Anxiety state 02/25/2014  . Dysmenorrhea 02/25/2014  . Depression 01/08/2014  . Chronic fatigue 01/08/2014    Current Outpatient Prescriptions on File Prior to Visit  Medication Sig Dispense Refill  . buPROPion (WELLBUTRIN SR) 150 MG 12 hr tablet Take 1 tablet (150 mg total) by mouth 2 (two) times daily. 60 tablet 5  . cholecalciferol (VITAMIN D) 1000 UNITS tablet Take 1,000 Units by mouth daily.    . Cyanocobalamin (VITAMIN B 12 PO) Take by mouth.    . escitalopram (LEXAPRO) 20 MG tablet Take 1 tablet (20 mg total) by mouth daily. 30 tablet 5  . Norethindrone-Ethinyl Estradiol-Fe Biphas (LO LOESTRIN FE) 1 MG-10 MCG / 10 MCG tablet Take 1 tablet by mouth daily. 3 Package 3   No current facility-administered medications on file prior to visit.      No Known Allergies Past Medical History  Diagnosis Date  . Anxiety   . Depression   . Rosacea    Past Surgical History  Procedure Laterality Date  . Cesarean section  2000  . Wisdom tooth extraction  age 84  . Dental surgery  01/2011    tooth removed,implant  History   Social History  . Marital Status: Divorced    Spouse Name: N/A    Number of Children: 1  . Years of Education: 16   Occupational History  . accountant    Social History Main Topics  . Smoking status: Never Smoker   . Smokeless tobacco: Never Used  . Alcohol Use: 2.4 oz/week    4 Glasses of wine per week     Comment: occassional wine with dinner  . Drug Use: No  . Sexual Activity:    Partners: Male    Birth Control/ Protection: Pill, Surgical     Comment: vasectomy   Other Topics Concern  . Not on file   Social History Narrative   Marital status:  Divorced, Dating x 5 years; no abuse.      Children: one son (20 yo)      lives with:   67 year old son and her boyfriend.      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      Tobacco:  No tobacco.      Alcohol:  Beer/wine occasionally      Exercise:  Not currently exercising   Family History  Problem Relation Age of Onset  . Thyroid disease Mother   .  Depression Father   . Mental illness Father     Bipolar  . Heart disease Maternal Grandmother   . Stroke Paternal Grandmother   . Stroke Paternal Grandfather   . Depression Brother   . Depression Brother    Patient Active Problem List   Diagnosis Date Noted  . Anxiety state 02/25/2014  . Dysmenorrhea 02/25/2014  . Depression 01/08/2014  . Chronic fatigue 01/08/2014   Current Outpatient Prescriptions on File Prior to Visit  Medication Sig Dispense Refill  . buPROPion (WELLBUTRIN SR) 150 MG 12 hr tablet Take 1 tablet (150 mg total) by mouth 2 (two) times daily. 60 tablet 5  . cholecalciferol (VITAMIN D) 1000 UNITS tablet Take 1,000 Units by mouth daily.    . Cyanocobalamin (VITAMIN B 12 PO) Take by mouth.    . escitalopram (LEXAPRO) 20 MG tablet Take 1 tablet (20 mg total) by mouth daily. 30 tablet 5  . Norethindrone-Ethinyl Estradiol-Fe Biphas (LO LOESTRIN FE) 1 MG-10 MCG / 10 MCG tablet Take 1 tablet by mouth daily. 3 Package 3   No current facility-administered medications on file prior to visit.       Review of Systems    see HPI Objective:   Physical Exam Physical Exam  Nursing note and vitals reviewed.  Fasting fingerstick 96 Constitutional: She is oriented to person, place, and time. She appears well-developed and well-nourished.  HENT:  Head: Normocephalic and atraumatic.  Cardiovascular: Normal rate and regular rhythm. Exam reveals no gallop and no friction rub.  No murmur heard.  Pulmonary/Chest: Breath sounds normal. She has no wheezes. She has no rales.  Neurological: She is alert and oriented to person, place, and time.  Skin: Skin is warm and dry.  Psychiatric: She has a normal mood and affect. Her behavior is normal.              Assessment & Plan:  Borderline glucose :  Will get AIC  Hematuria pt to get CT scan

## 2014-04-23 ENCOUNTER — Encounter: Payer: Self-pay | Admitting: Internal Medicine

## 2014-04-23 ENCOUNTER — Ambulatory Visit (INDEPENDENT_AMBULATORY_CARE_PROVIDER_SITE_OTHER): Payer: PRIVATE HEALTH INSURANCE | Admitting: Internal Medicine

## 2014-04-23 VITALS — BP 125/69 | HR 88 | Resp 16 | Ht 67.25 in | Wt 208.0 lb

## 2014-04-23 DIAGNOSIS — R7301 Impaired fasting glucose: Secondary | ICD-10-CM

## 2014-04-23 DIAGNOSIS — R319 Hematuria, unspecified: Secondary | ICD-10-CM

## 2014-04-23 LAB — GLUCOSE, POCT (MANUAL RESULT ENTRY): POC Glucose: 96 mg/dl (ref 70–99)

## 2014-04-25 ENCOUNTER — Encounter: Payer: Self-pay | Admitting: Internal Medicine

## 2014-04-25 LAB — HEMOGLOBIN A1C
Hgb A1c MFr Bld: 5.9 % — ABNORMAL HIGH (ref <5.7)
Mean Plasma Glucose: 123 mg/dL — ABNORMAL HIGH (ref <117)

## 2014-05-28 ENCOUNTER — Telehealth: Payer: Self-pay | Admitting: Internal Medicine

## 2014-05-28 NOTE — Telephone Encounter (Signed)
Call pt and see if she went to [facility] for her CT scan. I do not have results of this.  Pt requested we send her to [facility]

## 2014-05-28 NOTE — Telephone Encounter (Signed)
I called Cynthia Walters and left a message in regards to her CT scan. I also called [facility] and they said the patient cancelled the CT scan.

## 2014-06-04 ENCOUNTER — Ambulatory Visit: Payer: Self-pay | Admitting: Internal Medicine

## 2014-06-13 ENCOUNTER — Ambulatory Visit: Payer: PRIVATE HEALTH INSURANCE | Admitting: Internal Medicine

## 2014-07-01 ENCOUNTER — Encounter: Payer: Self-pay | Admitting: Internal Medicine

## 2014-07-02 ENCOUNTER — Telehealth: Payer: Self-pay

## 2014-07-02 NOTE — Telephone Encounter (Signed)
Mailed certified letter to patients address today @ 10:32 am.

## 2014-07-03 ENCOUNTER — Ambulatory Visit (INDEPENDENT_AMBULATORY_CARE_PROVIDER_SITE_OTHER): Payer: PRIVATE HEALTH INSURANCE | Admitting: Nurse Practitioner

## 2014-07-03 ENCOUNTER — Other Ambulatory Visit: Payer: Self-pay | Admitting: *Deleted

## 2014-07-03 ENCOUNTER — Encounter: Payer: Self-pay | Admitting: Nurse Practitioner

## 2014-07-03 VITALS — BP 110/66 | HR 72 | Ht 67.5 in | Wt 206.0 lb

## 2014-07-03 DIAGNOSIS — Z01419 Encounter for gynecological examination (general) (routine) without abnormal findings: Secondary | ICD-10-CM | POA: Diagnosis not present

## 2014-07-03 DIAGNOSIS — Z0000 Encounter for general adult medical examination without abnormal findings: Secondary | ICD-10-CM

## 2014-07-03 MED ORDER — NORETHIN-ETH ESTRAD-FE BIPHAS 1 MG-10 MCG / 10 MCG PO TABS: 1.0000 | ORAL_TABLET | Freq: Every day | ORAL | Status: DC

## 2014-07-03 NOTE — Progress Notes (Signed)
Patient ID: Cynthia Walters, female   DOB: 06-16-1965, 49 y.o.   MRN: [id number] 49 y.o. [id number] Divorced  Caucasian Fe here for annual exam.  Doing well on OCP and menstrual cycles last a day with spotting or no period.  Same partner for 6 years.  No STD concerns.  Still on Lexapro and Wellbutrin but still having some symptoms of depression, she is now referred to Psychiatrist but apt. is not made yet for medication management.  Patient's last menstrual period was 06/05/2014.          Sexually active: Yes.    The current method of family planning is vasectomy and OCP (estrogen/progesterone).    Exercising: No.  The patient does not participate in regular exercise at present. Smoker:  no  Health Maintenance: Pap:  04/30/11, negative with neg HR HPV MMG:  01/03/14, Bi-Rads 1:  Negative TDaP:  08/2011 Labs:  PCP, in EPIC   reports that she has never smoked. She has never used smokeless tobacco. She reports that she drinks about 4.2 oz of alcohol per week. She reports that she does not use illicit drugs.  Past Medical History  Diagnosis Date  . Anxiety   . Depression   . Rosacea     Past Surgical History  Procedure Laterality Date  . Cesarean section  2000  . Wisdom tooth extraction  age 23  . Dental surgery  01/2011    tooth removed,implant    Current Outpatient Prescriptions  Medication Sig Dispense Refill  . buPROPion (WELLBUTRIN SR) 150 MG 12 hr tablet Take 1 tablet (150 mg total) by mouth 2 (two) times daily. 60 tablet 5  . cholecalciferol (VITAMIN D) 1000 UNITS tablet Take 1,000 Units by mouth daily.    . Cyanocobalamin (VITAMIN B 12 PO) Take by mouth. Vitamin B12    . escitalopram (LEXAPRO) 10 MG tablet Take 10 mg by mouth daily.    . Norethindrone-Ethinyl Estradiol-Fe Biphas (LO LOESTRIN FE) 1 MG-10 MCG / 10 MCG tablet Take 1 tablet by mouth daily. 3 Package 3   No current facility-administered medications for this visit.    Family History  Problem Relation Age of Onset   . Thyroid disease Mother   . Depression Father   . Mental illness Father     Bipolar  . Heart disease Maternal Grandmother   . Stroke Paternal Grandmother   . Stroke Paternal Grandfather   . Depression Brother   . Depression Brother     ROS:  Pertinent items are noted in HPI.  Otherwise, a comprehensive ROS was negative.  Exam:   BP 110/66 mmHg  Pulse 72  Ht 5' 7.5" (1.715 m)  Wt 206 lb (93.441 kg)  BMI 31.77 kg/m2  LMP 06/05/2014 Height: 5' 7.5" (171.5 cm) Ht Readings from Last 3 Encounters:  07/03/14 5' 7.5" (1.715 m)  04/23/14 5' 7.25" (1.708 m)  04/09/14 5\' 7"  (1.702 m)    General appearance: alert, cooperative and appears stated age Head: Normocephalic, without obvious abnormality, atraumatic Neck: no adenopathy, supple, symmetrical, trachea midline and thyroid normal to inspection and palpation Lungs: clear to auscultation bilaterally Breasts: normal appearance, no masses or tenderness Heart: regular rate and rhythm Abdomen: soft, non-tender; no masses,  no organomegaly Extremities: extremities normal, atraumatic, no cyanosis or edema Skin: Skin color, texture, turgor normal. No rashes or lesions Lymph nodes: Cervical, supraclavicular, and axillary nodes normal. No abnormal inguinal nodes palpated Neurologic: Grossly normal   Pelvic: External genitalia:  no lesions  Urethra:  normal appearing urethra with no masses, tenderness or lesions              Bartholin's and Skene's: normal                 Vagina: normal appearing vagina with normal color and discharge, no lesions              Cervix: anteverted, bled after pap              Pap taken: Yes.   Bimanual Exam:  Uterus:  normal size, contour, position, consistency, mobility, non-tender              Adnexa: no mass, fullness, tenderness               Rectovaginal: Confirms               Anus:  normal sphincter tone, no lesions  Chaperone present: yes  A:  Well Woman with normal  exam  Vasectomy for birth control OCP for menorrhagia  Situational depression and anxiety - followed by PCP  Recent history of hematuria with negative urine C&S - will be seeing Urologist.  Glucose intolerance  P:   Reviewed health and wellness pertinent to exam  Pap smear taken today  Mammogram is due 10/16  Refill on OCP - Lo Loestrin for a year  Follow with pap  Counseled on breast self exam, mammography screening, use and side effects of OCP's, adequate intake of calcium and vitamin D, diet and exercise return annually or prn  An After Visit Summary was printed and given to the patient.

## 2014-07-03 NOTE — Patient Instructions (Signed)

## 2014-07-04 ENCOUNTER — Ambulatory Visit: Payer: PRIVATE HEALTH INSURANCE | Admitting: Internal Medicine

## 2014-07-04 ENCOUNTER — Telehealth: Payer: Self-pay | Admitting: *Deleted

## 2014-07-04 NOTE — Telephone Encounter (Signed)
Patient called to cancelled her appointment. She was advised that Dr. Coralyn Mark recommended the ABD CT still be done. She is aware but did not go due to money. She said she discuss abd issues with her GYN at her next appointment which is this month. She is also going to call us back to make a follow up appointment and get CT rescheduled. -eh

## 2014-07-06 LAB — IPS PAP TEST WITH HPV

## 2014-07-08 NOTE — Progress Notes (Signed)
Encounter reviewed by Dr. Kalijah Westfall Silva.  

## 2014-07-09 ENCOUNTER — Encounter: Payer: Self-pay | Admitting: *Deleted

## 2014-07-26 ENCOUNTER — Ambulatory Visit: Payer: PRIVATE HEALTH INSURANCE | Admitting: Internal Medicine

## 2015-03-19 ENCOUNTER — Ambulatory Visit (INDEPENDENT_AMBULATORY_CARE_PROVIDER_SITE_OTHER): Payer: Managed Care, Other (non HMO) | Admitting: Family Medicine

## 2015-03-19 ENCOUNTER — Encounter: Payer: Self-pay | Admitting: Family Medicine

## 2015-03-19 VITALS — BP 128/78 | HR 82 | Temp 98.7°F | Ht 67.0 in | Wt 210.0 lb

## 2015-03-19 DIAGNOSIS — F329 Major depressive disorder, single episode, unspecified: Secondary | ICD-10-CM

## 2015-03-19 DIAGNOSIS — E559 Vitamin D deficiency, unspecified: Secondary | ICD-10-CM

## 2015-03-19 DIAGNOSIS — R319 Hematuria, unspecified: Secondary | ICD-10-CM | POA: Insufficient documentation

## 2015-03-19 DIAGNOSIS — F32A Depression, unspecified: Secondary | ICD-10-CM

## 2015-03-19 DIAGNOSIS — E538 Deficiency of other specified B group vitamins: Secondary | ICD-10-CM

## 2015-03-19 DIAGNOSIS — F418 Other specified anxiety disorders: Secondary | ICD-10-CM

## 2015-03-19 DIAGNOSIS — R5382 Chronic fatigue, unspecified: Secondary | ICD-10-CM

## 2015-03-19 DIAGNOSIS — N946 Dysmenorrhea, unspecified: Secondary | ICD-10-CM

## 2015-03-19 DIAGNOSIS — R739 Hyperglycemia, unspecified: Secondary | ICD-10-CM | POA: Diagnosis not present

## 2015-03-19 HISTORY — DX: Hyperglycemia, unspecified: R73.9

## 2015-03-19 MED ORDER — BUPROPION HCL ER (XL) 150 MG PO TB24: 150.0000 mg | ORAL_TABLET | Freq: Every day | ORAL | Status: DC

## 2015-03-19 MED ORDER — ESCITALOPRAM OXALATE 10 MG PO TABS: 10.0000 mg | ORAL_TABLET | Freq: Every day | ORAL | Status: DC

## 2015-03-19 NOTE — Patient Instructions (Signed)
[employer] makes vitamin D, krill oil, probiotics they have a 10 strain cap daily    Basic Carbohydrate Counting for Diabetes Mellitus Carbohydrate counting is a method for keeping track of the amount of carbohydrates you eat. Eating carbohydrates naturally increases the level of sugar (glucose) in your blood, so it is important for you to know the amount that is okay for you to have in every meal. Carbohydrate counting helps keep the level of glucose in your blood within normal limits. The amount of carbohydrates allowed is different for every person. A dietitian can help you calculate the amount that is right for you. Once you know the amount of carbohydrates you can have, you can count the carbohydrates in the foods you want to eat. Carbohydrates are found in the following foods:  Grains, such as breads and cereals.  Dried beans and soy products.  Starchy vegetables, such as potatoes, peas, and corn.  Fruit and fruit juices.  Milk and yogurt.  Sweets and snack foods, such as cake, cookies, candy, chips, soft drinks, and fruit drinks. CARBOHYDRATE COUNTING There are two ways to count the carbohydrates in your food. You can use either of the methods or a combination of both. Reading the "Nutrition Facts" on [place] The "Nutrition Facts" is an area that is included on the labels of almost all packaged food and beverages in the [place]. It includes the serving size of that food or beverage and information about the nutrients in each serving of the food, including the grams (g) of carbohydrate per serving.  Decide the number of servings of this food or beverage that you will be able to eat or drink. Multiply that number of servings by the number of grams of carbohydrate that is listed on the label for that serving. The total will be the amount of carbohydrates you will be having when you eat or drink this food or beverage. Learning Standard Serving Sizes of Food When you eat food  that is not packaged or does not include "Nutrition Facts" on the label, you need to measure the servings in order to count the amount of carbohydrates.A serving of most carbohydrate-rich foods contains about 15 g of carbohydrates. The following list includes serving sizes of carbohydrate-rich foods that provide 15 g ofcarbohydrate per serving:   1 slice of bread (1 oz) or 1 six-inch tortilla.    of a hamburger bun or English muffin.  4-6 crackers.   cup unsweetened dry cereal.    cup hot cereal.   cup rice or pasta.    cup mashed potatoes or  of a large baked potato.  1 cup fresh fruit or one small piece of fruit.    cup canned or frozen fruit or fruit juice.  1 cup milk.   cup plain fat-free yogurt or yogurt sweetened with artificial sweeteners.   cup cooked dried beans or starchy vegetable, such as peas, corn, or potatoes.  Decide the number of standard-size servings that you will eat. Multiply that number of servings by 15 (the grams of carbohydrates in that serving). For example, if you eat 2 cups of strawberries, you will have eaten 2 servings and 30 g of carbohydrates (2 servings x 15 g = 30 g). For foods such as soups and casseroles, in which more than one food is mixed in, you will need to count the carbohydrates in each food that is included. EXAMPLE OF CARBOHYDRATE COUNTING Sample Dinner  3 oz chicken breast.   cup  of brown rice.   cup of corn.  1 cup milk.   1 cup strawberries with sugar-free whipped topping.  Carbohydrate Calculation Step 1: Identify the foods that contain carbohydrates:   Rice.   Corn.   Milk.   Strawberries. Step 2:Calculate the number of servings eaten of each:   2 servings of rice.   1 serving of corn.   1 serving of milk.   1 serving of strawberries. Step 3: Multiply each of those number of servings by 15 g:   2 servings of rice x 15 g = 30 g.   1 serving of corn x 15 g = 15 g.   1 serving  of milk x 15 g = 15 g.   1 serving of strawberries x 15 g = 15 g. Step 4: Add together all of the amounts to find the total grams of carbohydrates eaten: 30 g + 15 g + 15 g + 15 g = 75 g.   This information is not intended to replace advice given to you by your health care provider. Make sure you discuss any questions you have with your health care provider.   Document Released: 03/16/2005 Document Revised: 04/06/2014 Document Reviewed: 02/10/2013 Elsevier Interactive Patient Education [employer].

## 2015-03-19 NOTE — Assessment & Plan Note (Signed)
Ibuprofen early in cycle

## 2015-03-19 NOTE — Assessment & Plan Note (Signed)
Check labs, likely multifactorial, need 8 hr sleep, exercise and clean diet

## 2015-03-19 NOTE — Progress Notes (Signed)
Subjective:    Patient ID: Cynthia Walters, female    DOB: 12/23/1965, 49 y.o.   MRN: [id number]  Chief Complaint  Patient presents with  . Establish Care    HPI Patient is in today for new patient appointment. Her previous PMD Dr. Laurence Spates left practice. She is feeling well today but is frustrated with her depression, anxiety, insomnia and weight gain. She has used Wellbutrin and Lexapro in the past with good results but had stopped helping she was doing better and it might help with weight loss. Unfortunately her weight has continued to climb despite this change. Her past medical history otherwise significant for some hematuria and hyperglycemia. She denies polyuria or polydipsia. No dysuria. No back pain or abdominal pain. No recent acute illness. Denies CP/palp/SOB/HA/congestion/fevers/GI or GU c/o. Taking meds as prescribed  Past Medical History  Diagnosis Date  . Anxiety   . Depression   . Rosacea   . Vitamin D deficiency   . Vitamin B12 deficiency   . Hematuria   . Hyperglycemia 03/19/2015  . Depression with anxiety 01/08/2014    Past Surgical History  Procedure Laterality Date  . Cesarean section  2000  . Wisdom tooth extraction  age 47  . Dental surgery  01/2011    tooth removed,implant    Family History  Problem Relation Age of Onset  . Thyroid disease Mother   . Depression Father   . Mental illness Father     Bipolar  . Heart disease Maternal Grandmother   . Stroke Paternal Grandmother   . Diabetes Paternal Grandmother   . Stroke Paternal Grandfather   . Depression Brother   . Depression Brother     Social History   Social History  . Marital Status: Divorced    Spouse Name: N/A  . Number of Children: 1  . Years of Education: 16   Occupational History  . accountant    Social History Main Topics  . Smoking status: Never Smoker   . Smokeless tobacco: Never Used  . Alcohol Use: 4.2 oz/week    7 Glasses of wine per week     Comment:  occassional wine with dinner  . Drug Use: No  . Sexual Activity:    Partners: Male    Birth Control/ Protection: Pill, Surgical     Comment: vasectomy, live with son and boyfriend, works as Optometrist, no dietary restrictions   Other Topics Concern  . Not on file   Social History Narrative   Marital status:  Divorced, Dating x 5 years; no abuse.      Children: one son (91 yo)      lives with:   7 year old son and her boyfriend.      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      Tobacco:  No tobacco.      Alcohol:  Beer/wine occasionally      Exercise:  Not currently exercising    Outpatient Prescriptions Prior to Visit  Medication Sig Dispense Refill  . cholecalciferol (VITAMIN D) 1000 UNITS tablet Take 1,000 Units by mouth daily.    . Cyanocobalamin (VITAMIN B 12 PO) Take by mouth. Vitamin B12    . Norethindrone-Ethinyl Estradiol-Fe Biphas (LO LOESTRIN FE) 1 MG-10 MCG / 10 MCG tablet Take 1 tablet by mouth daily. 3 Package 3  . buPROPion (WELLBUTRIN SR) 150 MG 12 hr tablet Take 1 tablet (150 mg total) by mouth  2 (two) times daily. (Patient not taking: Reported on 03/19/2015) 60 tablet 5  . escitalopram (LEXAPRO) 10 MG tablet Take 10 mg by mouth daily. Reported on 03/19/2015     No facility-administered medications prior to visit.    No Known Allergies  Review of Systems  Constitutional: Positive for malaise/fatigue. Negative for fever and chills.  HENT: Negative for congestion and hearing loss.   Eyes: Negative for discharge.  Respiratory: Negative for cough, sputum production and shortness of breath.   Cardiovascular: Negative for chest pain, palpitations and leg swelling.  Gastrointestinal: Negative for heartburn, nausea, vomiting, abdominal pain, diarrhea, constipation and blood in stool.  Genitourinary: Negative for dysuria, urgency, frequency and hematuria.  Musculoskeletal: Negative for myalgias, back pain and falls.    Skin: Negative for rash.  Neurological: Negative for dizziness, sensory change, loss of consciousness, weakness and headaches.  Endo/Heme/Allergies: Negative for environmental allergies. Does not bruise/bleed easily.  Psychiatric/Behavioral: Positive for depression. Negative for suicidal ideas. The patient is nervous/anxious and has insomnia.        Objective:    Physical Exam  Constitutional: She is oriented to person, place, and time. She appears well-developed and well-nourished. No distress.  HENT:  Head: Normocephalic and atraumatic.  Eyes: Conjunctivae are normal.  Neck: Neck supple. No thyromegaly present.  Cardiovascular: Normal rate, regular rhythm and normal heart sounds.   No murmur heard. Pulmonary/Chest: Effort normal and breath sounds normal. No respiratory distress.  Abdominal: Soft. Bowel sounds are normal. She exhibits no distension and no mass. There is no tenderness.  Musculoskeletal: She exhibits no edema.  Lymphadenopathy:    She has no cervical adenopathy.  Neurological: She is alert and oriented to person, place, and time.  Skin: Skin is warm and dry.  Psychiatric: She has a normal mood and affect. Her behavior is normal.    BP 128/78 mmHg  Pulse 82  Temp(Src) 98.7 F (37.1 C) (Oral)  Ht 5\' 7"  (1.702 m)  Wt 210 lb (95.255 kg)  BMI 32.88 kg/m2  SpO2 99%  LMP 03/08/2015 Wt Readings from Last 3 Encounters:  03/19/15 210 lb (95.255 kg)  07/03/14 206 lb (93.441 kg)  04/23/14 208 lb (94.348 kg)     Lab Results  Component Value Date   WBC 7.6 01/08/2014   HGB 13.7 01/08/2014   HCT 40.0 01/08/2014   PLT 441* 01/08/2014   GLUCOSE 178* 04/09/2014   CHOL 152 01/08/2014   TRIG 96 01/08/2014   HDL 56 01/08/2014   LDLCALC 77 01/08/2014   ALT 14 01/08/2014   AST 15 01/08/2014   NA 138 04/09/2014   K 4.8 04/09/2014   CL 104 04/09/2014   CREATININE 0.77 04/09/2014   BUN 16 04/09/2014   CO2 26 04/09/2014   TSH 2.902 01/08/2014   HGBA1C 5.9*  04/23/2014    Lab Results  Component Value Date   TSH 2.902 01/08/2014   Lab Results  Component Value Date   WBC 7.6 01/08/2014   HGB 13.7 01/08/2014   HCT 40.0 01/08/2014   MCV 85.8 01/08/2014   PLT 441* 01/08/2014   Lab Results  Component Value Date   NA 138 04/09/2014   K 4.8 04/09/2014   CO2 26 04/09/2014   GLUCOSE 178* 04/09/2014   BUN 16 04/09/2014   CREATININE 0.77 04/09/2014   BILITOT 0.5 01/08/2014   ALKPHOS 62 01/08/2014   AST 15 01/08/2014   ALT 14 01/08/2014   PROT 6.6 01/08/2014   ALBUMIN 4.1 01/08/2014   CALCIUM  9.4 04/09/2014   Lab Results  Component Value Date   CHOL 152 01/08/2014   Lab Results  Component Value Date   HDL 56 01/08/2014   Lab Results  Component Value Date   LDLCALC 77 01/08/2014   Lab Results  Component Value Date   TRIG 96 01/08/2014   Lab Results  Component Value Date   CHOLHDL 2.7 01/08/2014   Lab Results  Component Value Date   HGBA1C 5.9* 04/23/2014       Assessment & Plan:   Problem List Items Addressed This Visit    Chronic fatigue    Check labs, likely multifactorial, need 8 hr sleep, exercise and clean diet      Depression with anxiety - Primary    Restart Wellbutrin 150 mg daily of the XL version consider increasing as needed. Has taken with Lexapro with good results in past so given rx to add this as well. Depression dates back to birth of 31 year old son.       Relevant Medications   buPROPion (WELLBUTRIN XL) 150 MG 24 hr tablet   Dysmenorrhea    Ibuprofen early in cycle      Hematuria    Check urine with next lab visit      Hyperglycemia    Check hgba1c. minimize simple carbs. Increase exercise as tolerated.      Relevant Orders   Lipid panel   Urinalysis   Comprehensive metabolic panel   Hemoglobin A1c   VITAMIN D 25 Hydroxy (Vit-D Deficiency, Fractures)   Vitamin B12    Other Visit Diagnoses    Vitamin B12 deficiency        Relevant Orders    Lipid panel    Urinalysis     Comprehensive metabolic panel    Hemoglobin A1c    VITAMIN D 25 Hydroxy (Vit-D Deficiency, Fractures)    Vitamin B12    Vitamin D deficiency        Relevant Orders    Lipid panel    Urinalysis    Comprehensive metabolic panel    Hemoglobin A1c    VITAMIN D 25 Hydroxy (Vit-D Deficiency, Fractures)    Vitamin B12       I have discontinued Ms. Gullatt buPROPion and escitalopram. I am also having her start on buPROPion and escitalopram. Additionally, I am having her maintain her Cyanocobalamin (VITAMIN B 12 PO), cholecalciferol, and Norethindrone-Ethinyl Estradiol-Fe Biphas.  Meds ordered this encounter  Medications  . buPROPion (WELLBUTRIN XL) 150 MG 24 hr tablet    Sig: Take 1 tablet (150 mg total) by mouth daily.    Dispense:  30 tablet    Refill:  2  . escitalopram (LEXAPRO) 10 MG tablet    Sig: Take 1 tablet (10 mg total) by mouth daily.    Dispense:  30 tablet    Refill:  3     Penni Homans, MD

## 2015-03-19 NOTE — Progress Notes (Signed)
Pre visit review using our clinic review tool, if applicable. No additional management support is needed unless otherwise documented below in the visit note. 

## 2015-03-19 NOTE — Assessment & Plan Note (Signed)
Restart Wellbutrin 150 mg daily of the XL version consider increasing as needed. Has taken with Lexapro with good results in past so given rx to add this as well. Depression dates back to birth of 47 year old son.

## 2015-03-19 NOTE — Assessment & Plan Note (Signed)
Check urine with next lab visit

## 2015-03-19 NOTE — Assessment & Plan Note (Signed)
Check hgba1c.minimize simple carbs. Increase exercise as tolerated.  

## 2015-03-20 ENCOUNTER — Other Ambulatory Visit (INDEPENDENT_AMBULATORY_CARE_PROVIDER_SITE_OTHER): Payer: Managed Care, Other (non HMO)

## 2015-03-20 DIAGNOSIS — R739 Hyperglycemia, unspecified: Secondary | ICD-10-CM | POA: Diagnosis not present

## 2015-03-20 DIAGNOSIS — E559 Vitamin D deficiency, unspecified: Secondary | ICD-10-CM

## 2015-03-20 DIAGNOSIS — F329 Major depressive disorder, single episode, unspecified: Secondary | ICD-10-CM | POA: Diagnosis not present

## 2015-03-20 DIAGNOSIS — E538 Deficiency of other specified B group vitamins: Secondary | ICD-10-CM

## 2015-03-20 DIAGNOSIS — F32A Depression, unspecified: Secondary | ICD-10-CM

## 2015-03-20 DIAGNOSIS — R319 Hematuria, unspecified: Secondary | ICD-10-CM

## 2015-03-20 LAB — COMPREHENSIVE METABOLIC PANEL
ALT: 28 U/L (ref 0–35)
AST: 30 U/L (ref 0–37)
Albumin: 4.1 g/dL (ref 3.5–5.2)
Alkaline Phosphatase: 60 U/L (ref 39–117)
BUN: 16 mg/dL (ref 6–23)
CO2: 22 mEq/L (ref 19–32)
Calcium: 9.2 mg/dL (ref 8.4–10.5)
Chloride: 106 mEq/L (ref 96–112)
Creatinine, Ser: 0.75 mg/dL (ref 0.40–1.20)
GFR: 87.78 mL/min (ref >60.00)
Glucose, Bld: 121 mg/dL — ABNORMAL HIGH (ref 70–99)
Potassium: 4.3 mEq/L (ref 3.5–5.1)
Sodium: 137 mEq/L (ref 135–145)
Total Bilirubin: 0.5 mg/dL (ref 0.2–1.2)
Total Protein: 7.2 g/dL (ref 6.0–8.3)

## 2015-03-20 LAB — URINALYSIS, ROUTINE W REFLEX MICROSCOPIC
Bilirubin Urine: NEGATIVE
Ketones, ur: NEGATIVE
Leukocytes, UA: NEGATIVE
Nitrite: NEGATIVE
Specific Gravity, Urine: 1.025 (ref 1.000–1.030)
Total Protein, Urine: NEGATIVE
Urine Glucose: NEGATIVE
Urobilinogen, UA: 0.2 (ref 0.0–1.0)
pH: 5.5 (ref 5.0–8.0)

## 2015-03-20 LAB — LIPID PANEL
Cholesterol: 156 mg/dL (ref 0–200)
HDL: 48.7 mg/dL (ref >39.00)
LDL Cholesterol: 90 mg/dL (ref 0–99)
NonHDL: 107.2
Total CHOL/HDL Ratio: 3
Triglycerides: 86 mg/dL (ref 0.0–149.0)
VLDL: 17.2 mg/dL (ref 0.0–40.0)

## 2015-03-20 LAB — VITAMIN D 25 HYDROXY (VIT D DEFICIENCY, FRACTURES): VITD: 25.65 ng/mL — ABNORMAL LOW (ref 30.00–100.00)

## 2015-03-20 LAB — HEMOGLOBIN A1C: Hgb A1c MFr Bld: 6.1 % (ref 4.6–6.5)

## 2015-03-20 LAB — VITAMIN B12: Vitamin B-12: 451 pg/mL (ref 211–911)

## 2015-05-14 ENCOUNTER — Encounter: Payer: Self-pay | Admitting: Family Medicine

## 2015-05-14 ENCOUNTER — Ambulatory Visit (INDEPENDENT_AMBULATORY_CARE_PROVIDER_SITE_OTHER): Payer: Managed Care, Other (non HMO) | Admitting: Family Medicine

## 2015-05-14 VITALS — BP 128/82 | HR 98 | Temp 98.8°F | Ht 67.0 in | Wt 208.2 lb

## 2015-05-14 DIAGNOSIS — R739 Hyperglycemia, unspecified: Secondary | ICD-10-CM | POA: Diagnosis not present

## 2015-05-14 DIAGNOSIS — R5382 Chronic fatigue, unspecified: Secondary | ICD-10-CM

## 2015-05-14 DIAGNOSIS — R7989 Other specified abnormal findings of blood chemistry: Secondary | ICD-10-CM

## 2015-05-14 DIAGNOSIS — E782 Mixed hyperlipidemia: Secondary | ICD-10-CM

## 2015-05-14 DIAGNOSIS — N946 Dysmenorrhea, unspecified: Secondary | ICD-10-CM

## 2015-05-14 DIAGNOSIS — F418 Other specified anxiety disorders: Secondary | ICD-10-CM | POA: Diagnosis not present

## 2015-05-14 MED ORDER — BENZONATATE 100 MG PO CAPS: 100.0000 mg | ORAL_CAPSULE | Freq: Two times a day (BID) | ORAL | Status: DC | PRN

## 2015-05-14 NOTE — Patient Instructions (Addendum)
Vitamin D 2000 IU daily Probiotics daily NOW company Hematuria, Adult Hematuria is blood in your urine. It can be caused by a bladder infection, kidney infection, prostate infection, kidney stone, or cancer of your urinary tract. Infections can usually be treated with medicine, and a kidney stone usually will pass through your urine. If neither of these is the cause of your hematuria, further workup to find out the reason may be needed. It is very important that you tell your health care provider about any blood you see in your urine, even if the blood stops without treatment or happens without causing pain. Blood in your urine that happens and then stops and then happens again can be a symptom of a very serious condition. Also, pain is not a symptom in the initial stages of many urinary cancers. HOME CARE INSTRUCTIONS   Drink lots of fluid, 3-4 quarts a day. If you have been diagnosed with an infection, cranberry juice is especially recommended, in addition to large amounts of water.  Avoid caffeine, tea, and carbonated beverages because they tend to irritate the bladder.  Avoid alcohol because it may irritate the prostate.  Take all medicines as directed by your health care provider.  If you were prescribed an antibiotic medicine, finish it all even if you start to feel better.  If you have been diagnosed with a kidney stone, follow your health care provider's instructions regarding straining your urine to catch the stone.  Empty your bladder often. Avoid holding urine for long periods of time.  After a bowel movement, women should cleanse front to back. Use each tissue only once.  Empty your bladder before and after sexual intercourse if you are a female. SEEK MEDICAL CARE IF:  You develop back pain.  You have a fever.  You have a feeling of sickness in your stomach (nausea) or vomiting.  Your symptoms are not better in 3 days. Return sooner if you are getting worse. SEEK  IMMEDIATE MEDICAL CARE IF:   You develop severe vomiting and are unable to keep the medicine down.  You develop severe back or abdominal pain despite taking your medicines.  You begin passing a large amount of blood or clots in your urine.  You feel extremely weak or faint, or you pass out. MAKE SURE YOU:   Understand these instructions.  Will watch your condition.  Will get help right away if you are not doing well or get worse.   This information is not intended to replace advice given to you by your health care provider. Make sure you discuss any questions you have with your health care provider.   Document Released: 03/16/2005 Document Revised: 04/06/2014 Document Reviewed: 11/14/2012 Elsevier Interactive Patient Education [employer].

## 2015-05-14 NOTE — Progress Notes (Signed)
Pre visit review using our clinic review tool, if applicable. No additional management support is needed unless otherwise documented below in the visit note. 

## 2015-05-15 NOTE — Telephone Encounter (Signed)
Orders placed.

## 2015-05-19 NOTE — Assessment & Plan Note (Signed)
imrpved on Wellbutrin and Lexapro continue the same for now

## 2015-05-19 NOTE — Progress Notes (Signed)
Patient ID: Cynthia Walters, female   DOB: 10-10-1965, 51 y.o.   MRN: [id number]   Subjective:    Patient ID: Cynthia Walters, female    DOB: [birth year], 50 y.o.   MRN: [id number]  Chief Complaint  Patient presents with  . Follow-up    HPI Patient is in today for follow up. She is feeling well today, no new or acute concerns. Was struggling with URI symptoms but those symptoms are largely resolved. Denies CP/palp/SOB/HA/congestion/fevers/GI or GU c/o. Taking meds as prescribed  Past Medical History  Diagnosis Date  . Anxiety   . Depression   . Rosacea   . Vitamin D deficiency   . Vitamin B12 deficiency   . Hematuria   . Hyperglycemia 03/19/2015  . Depression with anxiety 01/08/2014    Past Surgical History  Procedure Laterality Date  . Cesarean section  2000  . Wisdom tooth extraction  age 37  . Dental surgery  01/2011    tooth removed,implant    Family History  Problem Relation Age of Onset  . Thyroid disease Mother   . Depression Father   . Mental illness Father     Bipolar  . Heart disease Maternal Grandmother   . Stroke Paternal Grandmother   . Diabetes Paternal Grandmother   . Stroke Paternal Grandfather   . Depression Brother   . Depression Brother     Social History   Social History  . Marital Status: Divorced    Spouse Name: N/A  . Number of Children: 1  . Years of Education: 16   Occupational History  . accountant    Social History Main Topics  . Smoking status: Never Smoker   . Smokeless tobacco: Never Used  . Alcohol Use: 4.2 oz/week    7 Glasses of wine per week     Comment: occassional wine with dinner  . Drug Use: No  . Sexual Activity:    Partners: Male    Birth Control/ Protection: Pill, Surgical     Comment: vasectomy, live with son and boyfriend, works as Optometrist, no dietary restrictions   Other Topics Concern  . Not on file   Social History Narrative   Marital status:  Divorced, Dating x 5 years; no abuse.      Children:  one son (55 yo)      lives with:   49 year old son and her boyfriend.      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      Tobacco:  No tobacco.      Alcohol:  Beer/wine occasionally      Exercise:  Not currently exercising    Outpatient Prescriptions Prior to Visit  Medication Sig Dispense Refill  . buPROPion (WELLBUTRIN XL) 150 MG 24 hr tablet Take 1 tablet (150 mg total) by mouth daily. 30 tablet 2  . cholecalciferol (VITAMIN D) 1000 UNITS tablet Take 1,000 Units by mouth daily.    . Cyanocobalamin (VITAMIN B 12 PO) Take by mouth. Vitamin B12    . escitalopram (LEXAPRO) 10 MG tablet Take 1 tablet (10 mg total) by mouth daily. 30 tablet 3  . Norethindrone-Ethinyl Estradiol-Fe Biphas (LO LOESTRIN FE) 1 MG-10 MCG / 10 MCG tablet Take 1 tablet by mouth daily. 3 Package 3   No facility-administered medications prior to visit.    No Known Allergies  Review of Systems  Constitutional: Negative for fever and malaise/fatigue.  HENT: Negative for congestion.  Eyes: Negative for discharge.  Respiratory: Negative for shortness of breath.   Cardiovascular: Negative for chest pain, palpitations and leg swelling.  Gastrointestinal: Negative for nausea and abdominal pain.  Genitourinary: Negative for dysuria.  Musculoskeletal: Negative for falls.  Skin: Negative for rash.  Neurological: Negative for loss of consciousness and headaches.  Endo/Heme/Allergies: Negative for environmental allergies.  Psychiatric/Behavioral: Negative for depression. The patient is not nervous/anxious.        Objective:    Physical Exam  Constitutional: She is oriented to person, place, and time. She appears well-developed and well-nourished. No distress.  HENT:  Head: Normocephalic and atraumatic.  Nose: Nose normal.  Eyes: Right eye exhibits no discharge. Left eye exhibits no discharge.  Neck: Normal range of motion. Neck supple.  Cardiovascular: Normal  rate and regular rhythm.   No murmur heard. Pulmonary/Chest: Effort normal and breath sounds normal.  Abdominal: Soft. Bowel sounds are normal. There is no tenderness.  Musculoskeletal: She exhibits no edema.  Neurological: She is alert and oriented to person, place, and time.  Skin: Skin is warm and dry.  Psychiatric: She has a normal mood and affect.  Nursing note and vitals reviewed.   BP 128/82 mmHg  Pulse 98  Temp(Src) 98.8 F (37.1 C) (Oral)  Ht 5\' 7"  (1.702 m)  Wt 208 lb 4 oz (94.462 kg)  BMI 32.61 kg/m2  SpO2 97% Wt Readings from Last 3 Encounters:  05/14/15 208 lb 4 oz (94.462 kg)  03/19/15 210 lb (95.255 kg)  07/03/14 206 lb (93.441 kg)     Lab Results  Component Value Date   WBC 7.6 01/08/2014   HGB 13.7 01/08/2014   HCT 40.0 01/08/2014   PLT 441* 01/08/2014   GLUCOSE 121* 03/20/2015   CHOL 156 03/20/2015   TRIG 86.0 03/20/2015   HDL 48.70 03/20/2015   LDLCALC 90 03/20/2015   ALT 28 03/20/2015   AST 30 03/20/2015   NA 137 03/20/2015   K 4.3 03/20/2015   CL 106 03/20/2015   CREATININE 0.75 03/20/2015   BUN 16 03/20/2015   CO2 22 03/20/2015   TSH 2.902 01/08/2014   HGBA1C 6.1 03/20/2015    Lab Results  Component Value Date   TSH 2.902 01/08/2014   Lab Results  Component Value Date   WBC 7.6 01/08/2014   HGB 13.7 01/08/2014   HCT 40.0 01/08/2014   MCV 85.8 01/08/2014   PLT 441* 01/08/2014   Lab Results  Component Value Date   NA 137 03/20/2015   K 4.3 03/20/2015   CO2 22 03/20/2015   GLUCOSE 121* 03/20/2015   BUN 16 03/20/2015   CREATININE 0.75 03/20/2015   BILITOT 0.5 03/20/2015   ALKPHOS 60 03/20/2015   AST 30 03/20/2015   ALT 28 03/20/2015   PROT 7.2 03/20/2015   ALBUMIN 4.1 03/20/2015   CALCIUM 9.2 03/20/2015   GFR 87.78 03/20/2015   Lab Results  Component Value Date   CHOL 156 03/20/2015   Lab Results  Component Value Date   HDL 48.70 03/20/2015   Lab Results  Component Value Date   LDLCALC 90 03/20/2015   Lab  Results  Component Value Date   TRIG 86.0 03/20/2015   Lab Results  Component Value Date   CHOLHDL 3 03/20/2015   Lab Results  Component Value Date   HGBA1C 6.1 03/20/2015       Assessment & Plan:   Problem List Items Addressed This Visit    Depression with anxiety    imrpved  on Wellbutrin and Lexapro continue the same for now      Hyperglycemia - Primary    hgba1c acceptable, minimize simple carbs. Increase exercise as tolerated. Continue current meds         I am having Ms. Fayson start on benzonatate. I am also having her maintain her Cyanocobalamin (VITAMIN B 12 PO), cholecalciferol, Norethindrone-Ethinyl Estradiol-Fe Biphas, buPROPion, and escitalopram.  Meds ordered this encounter  Medications  . benzonatate (TESSALON) 100 MG capsule    Sig: Take 1 capsule (100 mg total) by mouth 2 (two) times daily as needed for cough.    Dispense:  20 capsule    Refill:  1     Penni Homans, MD

## 2015-05-19 NOTE — Assessment & Plan Note (Signed)
hgba1c acceptable, minimize simple carbs. Increase exercise as tolerated. Continue current meds 

## 2015-06-05 ENCOUNTER — Telehealth: Payer: Self-pay | Admitting: Emergency Medicine

## 2015-06-05 ENCOUNTER — Other Ambulatory Visit: Payer: Self-pay | Admitting: Family Medicine

## 2015-06-05 ENCOUNTER — Encounter: Payer: Self-pay | Admitting: Nurse Practitioner

## 2015-06-05 ENCOUNTER — Encounter: Payer: Self-pay | Admitting: Family Medicine

## 2015-06-05 DIAGNOSIS — F329 Major depressive disorder, single episode, unspecified: Secondary | ICD-10-CM

## 2015-06-05 DIAGNOSIS — F32A Depression, unspecified: Secondary | ICD-10-CM

## 2015-06-05 MED ORDER — NORETHIN-ETH ESTRAD-FE BIPHAS 1 MG-10 MCG / 10 MCG PO TABS: 1.0000 | ORAL_TABLET | Freq: Every day | ORAL | Status: DC

## 2015-06-05 MED ORDER — BUPROPION HCL ER (XL) 150 MG PO TB24: 150.0000 mg | ORAL_TABLET | Freq: Every day | ORAL | Status: DC

## 2015-06-05 NOTE — Telephone Encounter (Signed)
  Chief Complaint  Patient presents with  . Medication Management  . Advice Only    ===View-only below this line===   ----- Message -----    From: Dionisio Paschal    Sent: 06/05/2015  9:51 AM EST      To: ROLEN-GRUBB, PATRICIA, FNP Subject: Non-Urgent Medical Question  Hi  I currently have a prescription for Lo Loestrin Fe 28S.  I only have 2 weeks left of my current prescription and no refills.  My appointment for my annual exam is not until April 11.  I was wondering if I could come by and get a sample pack to use until I can get my prescription renewed.  When I get my new prescription, I wanted to request a 90 day supply and I will need a new coupon to get it at the discounted rate.  Please advise.  Thank you, Cynthia Walters

## 2015-06-06 MED ORDER — NORETHIN-ETH ESTRAD-FE BIPHAS 1 MG-10 MCG / 10 MCG PO TABS: 1.0000 | ORAL_TABLET | Freq: Every day | ORAL | Status: DC

## 2015-07-09 ENCOUNTER — Ambulatory Visit (INDEPENDENT_AMBULATORY_CARE_PROVIDER_SITE_OTHER): Payer: Managed Care, Other (non HMO) | Admitting: Nurse Practitioner

## 2015-07-09 ENCOUNTER — Encounter: Payer: Self-pay | Admitting: Nurse Practitioner

## 2015-07-09 ENCOUNTER — Telehealth: Payer: Self-pay | Admitting: Obstetrics & Gynecology

## 2015-07-09 VITALS — BP 122/68 | HR 68 | Ht 67.5 in | Wt 207.0 lb

## 2015-07-09 DIAGNOSIS — Z0000 Encounter for general adult medical examination without abnormal findings: Secondary | ICD-10-CM

## 2015-07-09 DIAGNOSIS — Z01419 Encounter for gynecological examination (general) (routine) without abnormal findings: Secondary | ICD-10-CM

## 2015-07-09 DIAGNOSIS — N939 Abnormal uterine and vaginal bleeding, unspecified: Secondary | ICD-10-CM

## 2015-07-09 DIAGNOSIS — R319 Hematuria, unspecified: Secondary | ICD-10-CM

## 2015-07-09 LAB — POCT URINALYSIS DIPSTICK
Bilirubin, UA: NEGATIVE
Blood, UA: 50
Glucose, UA: NORMAL
Ketones, UA: NEGATIVE
Nitrite, UA: NEGATIVE
Protein, UA: NEGATIVE
Urobilinogen, UA: NEGATIVE
pH, UA: 5

## 2015-07-09 MED ORDER — NORETHIN-ETH ESTRAD-FE BIPHAS 1 MG-10 MCG / 10 MCG PO TABS: 1.0000 | ORAL_TABLET | Freq: Every day | ORAL | Status: DC

## 2015-07-09 NOTE — Patient Instructions (Signed)

## 2015-07-09 NOTE — Telephone Encounter (Signed)
Spoke with patient regarding benefit for sonohystogram. Patient understood and agreeable. Patient ready to schedule with Dr Sabra Heck as suggested by Ms Chong Sicilian. Patient offered appointment this week and next. Patient prefers to schedule asap. Patient scheduled for 07/11/15 with Dr Sabra Heck. Patient agreeable to arrival date/time. Patient understood and agreeable to 72 hour cancellation policy with [id number] fee. No further questions. Ok to close.

## 2015-07-09 NOTE — Progress Notes (Signed)
Patient ID: Cynthia Walters, female   DOB: 11-Dec-1965, 50 y.o.   MRN: [id number]  50 y.o. G6P1001 Divorced  Caucasian Fe here for annual exam.  She is having occasional spotting that is not related to off week of OCP. Not related to post coital or lifting.  Not related to other PMS symptoms.  Since on Lo Loestrin usually has no menses. Same partner for 6 yrs.  She is still having problems with hematuria and concerned.  Request a repeat urine.  Patient's last menstrual period was 04/14/2015.   Spotting (guessed at date)       Sexually active: Yes.    The current method of family planning is vasectomy and OCP.    Exercising: No.  The patient does not participate in regular exercise at present. Smoker:  no  Health Maintenance: Pap: 07/03/14, Negative with neg HR HPV MMG:  01/03/14, Bi-Rads 1: Negative TDaP:  09/16/11 HIV: done today Labs: PCP in [facility] 02/2015   reports that she has never smoked. She has never used smokeless tobacco. She reports that she drinks about 3.0 oz of alcohol per week. She reports that she does not use illicit drugs.  Past Medical History  Diagnosis Date  . Anxiety   . Depression   . Rosacea   . Vitamin D deficiency   . Vitamin B12 deficiency   . Hematuria   . Hyperglycemia 03/19/2015  . Depression with anxiety 01/08/2014    Past Surgical History  Procedure Laterality Date  . Cesarean section  2000  . Wisdom tooth extraction  age 16  . Dental surgery  01/2011    tooth removed,implant    Current Outpatient Prescriptions  Medication Sig Dispense Refill  . buPROPion (WELLBUTRIN XL) 150 MG 24 hr tablet Take 1 tablet (150 mg total) by mouth daily. 30 tablet 2  . cholecalciferol (VITAMIN D) 1000 UNITS tablet Take 1,000 Units by mouth daily.    Marland Kitchen escitalopram (LEXAPRO) 10 MG tablet Take 1 tablet (10 mg total) by mouth daily. 30 tablet 3  . Norethindrone-Ethinyl Estradiol-Fe Biphas (LO LOESTRIN FE) 1 MG-10 MCG / 10 MCG tablet Take 1 tablet by mouth daily. 3 Package  0  . Cyanocobalamin (VITAMIN B 12 PO) Take by mouth. Reported on 07/09/2015     No current facility-administered medications for this visit.    Family History  Problem Relation Age of Onset  . Thyroid disease Mother   . Depression Father   . Mental illness Father     Bipolar  . Heart disease Maternal Grandmother   . Stroke Paternal Grandmother   . Diabetes Paternal Grandmother   . Stroke Paternal Grandfather   . Depression Brother   . Depression Brother     ROS:  Pertinent items are noted in HPI.  Otherwise, a comprehensive ROS was negative.  Exam:   BP 122/68 mmHg  Pulse 68  Ht 5' 7.5" (1.715 m)  Wt 207 lb (93.895 kg)  BMI 31.92 kg/m2  LMP 04/14/2015 Height: 5' 7.5" (171.5 cm) Ht Readings from Last 3 Encounters:  07/09/15 5' 7.5" (1.715 m)  05/14/15 5\' 7"  (1.702 m)  03/19/15 5\' 7"  (1.702 m)    General appearance: alert, cooperative and appears stated age Head: Normocephalic, without obvious abnormality, atraumatic Neck: no adenopathy, supple, symmetrical, trachea midline and thyroid normal to inspection and palpation Lungs: clear to auscultation bilaterally Breasts: normal appearance, no masses or tenderness Heart: regular rate and rhythm Abdomen: soft, non-tender; no masses,  no organomegaly Extremities: extremities  normal, atraumatic, no cyanosis or edema Skin: Skin color, texture, turgor normal. No rashes or lesions Lymph nodes: Cervical, supraclavicular, and axillary nodes normal. No abnormal inguinal nodes palpated Neurologic: Grossly normal   Pelvic: External genitalia:  no lesions              Urethra:  normal appearing urethra with no masses, tenderness or lesions              Bartholin's and Skene's: normal                 Vagina: normal appearing vagina with normal color and thin clear to white discharge, no lesions              Cervix: anteverted              Pap taken: No. Bimanual Exam:  Uterus:  normal size, contour, position, consistency,  mobility, non-tender              Adnexa: no mass, fullness, tenderness               Rectovaginal: Confirms               Anus:  normal sphincter tone, no lesions  Chaperone present: yes  A:  Well Woman with normal exam  Vasectomy for birth control OCP for menorrhagia - current AUB Situational depression and anxiety - followed by PCP Recent history of hematuria Glucose intolerance  R/O UTI, R/O vaginitis   P:   Reviewed health and wellness pertinent to exam  Pap smear as above  Mammogram is due now and she will schedule  Only given 3 month refill of OCP until Mammo is done  Will schedule her to have SHGM to evaluate AUB  She has seen Dr. Sabra Heck in the past  Follow with urine and Affirm  Counseled on breast self exam, mammography screening, use and side effects of OCP's, adequate intake of calcium and vitamin D, diet and exercise  return annually or prn  An After Visit Summary was printed and given to the patient.

## 2015-07-10 ENCOUNTER — Encounter: Payer: Self-pay | Admitting: Nurse Practitioner

## 2015-07-10 ENCOUNTER — Telehealth: Payer: Self-pay

## 2015-07-10 LAB — URINALYSIS, MICROSCOPIC ONLY
Bacteria, UA: NONE SEEN [HPF]
Casts: NONE SEEN [LPF]
Crystals: NONE SEEN [HPF]
Yeast: NONE SEEN [HPF]

## 2015-07-10 LAB — WET PREP BY MOLECULAR PROBE
Candida species: NEGATIVE
Gardnerella vaginalis: NEGATIVE
Trichomonas vaginosis: NEGATIVE

## 2015-07-10 NOTE — Telephone Encounter (Signed)
Patient calling regarding the status of the MYchart message she left earlier.

## 2015-07-10 NOTE — Telephone Encounter (Signed)
My apologies. I thought I sent a message through to you earlier today that it is fine to proceed with the ultrasound visit tomorrow.

## 2015-07-10 NOTE — Telephone Encounter (Signed)
Routing to Dr.Silva for review and advise as Dr.Miller is out of the office today. 

## 2015-07-10 NOTE — Telephone Encounter (Signed)
Left detailed message at number provided [phone], okay per ROI. Advised of message as seen below from [place]. Mychart message also sent to patient stating it is okay to keep her appointment as scheduled for tomorrow.  Cc: Dr.Miller  Routing to provider for final review. Patient agreeable to disposition. Will close encounter.

## 2015-07-10 NOTE — Telephone Encounter (Signed)
Visit Follow-Up Question  Message [id number]   From  TESSA LAURY   To  Kem Boroughs, FNP   Sent  07/10/2015 10:02 AM     I scheduled my ultrasound for this Thursday, April 13. I just notice on the instructions it says that I should be in day 7 - 10 of my cycle. I think I am on day 18 right now. I hope it's not a problem - they said it would be $150 to cancel the appointment but I did not know there was a window of time it needed to be scheduled.   Thanks!      Responsible Party    Pool - [facility] Pool No one has taken responsibility for this message.     No actions have been taken on this message.     Current method of birth control is Lo Loestrin Fe and vasectomy. Per OV note on 07/09/2015 patient typically does not cycle with Lo Loestrin Fe, but has been experiencing occasional spotting. Last episode of spotting was 04/14/2015. She is scheduled to have a [facility] tomorrow 07/11/2015. Is currently on cycle day 18. Routing to Dr.Silva for review as Dr.Miller is out of the office today. Okay to keep as scheduled?

## 2015-07-11 ENCOUNTER — Ambulatory Visit (INDEPENDENT_AMBULATORY_CARE_PROVIDER_SITE_OTHER): Payer: Managed Care, Other (non HMO)

## 2015-07-11 ENCOUNTER — Ambulatory Visit (INDEPENDENT_AMBULATORY_CARE_PROVIDER_SITE_OTHER): Payer: Managed Care, Other (non HMO) | Admitting: Obstetrics & Gynecology

## 2015-07-11 ENCOUNTER — Other Ambulatory Visit: Payer: Self-pay | Admitting: Obstetrics & Gynecology

## 2015-07-11 VITALS — BP 124/66 | HR 90 | Resp 16 | Ht 67.5 in | Wt 210.0 lb

## 2015-07-11 DIAGNOSIS — D251 Intramural leiomyoma of uterus: Secondary | ICD-10-CM | POA: Diagnosis not present

## 2015-07-11 DIAGNOSIS — N939 Abnormal uterine and vaginal bleeding, unspecified: Secondary | ICD-10-CM

## 2015-07-11 LAB — URINE CULTURE: Colony Count: 7000

## 2015-07-11 NOTE — Progress Notes (Signed)
50 y.o. G1P1 Divorced [place] female here for a pelvic ultrasound due to DUB on OCPs.  Pt denies pelvic pain.  Pt with hx of fibroids and dysmenorrhea that has completely resolved on OCPs.  She does get generic now.  Over the last year, she's had some issues with spotting that she notices only with wiping.  She thought maybe this was only related to blood in her urine but urine micro on our office has been negative.  Pt denies other urinary symptoms.  Denies bleeding with intercourse.   Patient's last menstrual period was 04/14/2015.  Contraception: OCPs  Technique:  Both transabdominal and transvaginal ultrasound examinations of the pelvis were performed. Transabdominal technique was performed for global imaging of the pelvis including uterus, ovaries, adnexal regions, and pelvic cul-de-sac.  It was necessary to proceed with endovaginal exam following the abdominal ultrasound transabdominal exam to visualize the endometrium and adnexa.  Color and duplex Doppler ultrasound was utilized to evaluate blood flow to the ovaries.   FINDINGS: Uterus: 7.7 x 4.6 x 4.2cm with multiple fibroids 1.5cm, 0.9cm, 0.6cm, 0.8cm, and 1.0cm.  All are intramural. Endometrium: 2.0cm with calcifications Adnexa:  Left: 2.4 x 1.3 x 1.6cm     Right: 2.3 x 1.0 x 1.0 Cul de sac: no free fluid  SHSG:  After obtaining appropriate verbal consent from patient, the cervix was visualized using a speculum, and prepped with betadine.  A tenaculum  was applied to the cervix.  Dilation of the cervix was not necessary. The catheter was passed into the uterus and sterile saline introduced, with the following findings: thin endometrium with calcifications along endometrial cavity wall.  No intracavitary lesions.  No lesions.  Do not feel biopsy is needed.  Discussion:  Reviewed findings with pt.  Specifically, calcifications discussed.  With thin endometrium, do not feel biopsy will give any significant results.  Bleeding is most likely  due to OCPs, generic, but cost is low and pt is otherwise doing well on them.  Do not feel need to make any changes.  Due to age, i'd like her to stay on the lowest OCPS estrogen dosage if possible.    Pt knows if irregular spotting/bleeding worsens, to please call.  Would consider change at that time.    Assessment:  DUB with OCPs, normal ultrasound except for intramural fibroids and endometrial calcifications  Plan:  Will continue to follow conservatively and proceed with changing OCPs only if spotting worsens.  Pt comfortable with plan and will call with any changes.    ~25 minutes spent with patient >50% of time was in face to face discussion of above.

## 2015-07-12 NOTE — Progress Notes (Signed)
Reviewed personally.  M. Suzanne Wendolyn Raso, MD.  

## 2015-07-13 ENCOUNTER — Encounter: Payer: Self-pay | Admitting: Obstetrics & Gynecology

## 2015-07-13 DIAGNOSIS — D251 Intramural leiomyoma of uterus: Secondary | ICD-10-CM | POA: Insufficient documentation

## 2015-08-07 ENCOUNTER — Other Ambulatory Visit (INDEPENDENT_AMBULATORY_CARE_PROVIDER_SITE_OTHER): Payer: Managed Care, Other (non HMO)

## 2015-08-07 DIAGNOSIS — E782 Mixed hyperlipidemia: Secondary | ICD-10-CM | POA: Diagnosis not present

## 2015-08-07 DIAGNOSIS — R5382 Chronic fatigue, unspecified: Secondary | ICD-10-CM | POA: Diagnosis not present

## 2015-08-07 DIAGNOSIS — R7989 Other specified abnormal findings of blood chemistry: Secondary | ICD-10-CM | POA: Diagnosis not present

## 2015-08-07 DIAGNOSIS — R739 Hyperglycemia, unspecified: Secondary | ICD-10-CM

## 2015-08-07 DIAGNOSIS — N946 Dysmenorrhea, unspecified: Secondary | ICD-10-CM

## 2015-08-07 LAB — COMPLETE METABOLIC PANEL WITH GFR
ALT: 25 U/L (ref 6–29)
AST: 25 U/L (ref 10–35)
Albumin: 4.1 g/dL (ref 3.6–5.1)
Alkaline Phosphatase: 62 U/L (ref 33–115)
BUN: 13 mg/dL (ref 7–25)
CO2: 21 mmol/L (ref 20–31)
Calcium: 9 mg/dL (ref 8.6–10.2)
Chloride: 104 mmol/L (ref 98–110)
Creat: 0.72 mg/dL (ref 0.50–1.10)
GFR, Est African American: >89 mL/min (ref >=60)
GFR, Est Non African American: >89 mL/min (ref >=60)
Glucose, Bld: 112 mg/dL — ABNORMAL HIGH (ref 65–99)
Potassium: 4.7 mmol/L (ref 3.5–5.3)
Sodium: 137 mmol/L (ref 135–146)
Total Bilirubin: 0.4 mg/dL (ref 0.2–1.2)
Total Protein: 6.8 g/dL (ref 6.1–8.1)

## 2015-08-07 LAB — LIPID PANEL
Cholesterol: 181 mg/dL (ref 0–200)
HDL: 55.6 mg/dL (ref >39.00)
LDL Cholesterol: 106 mg/dL — ABNORMAL HIGH (ref 0–99)
NonHDL: 125.65
Total CHOL/HDL Ratio: 3
Triglycerides: 96 mg/dL (ref 0.0–149.0)
VLDL: 19.2 mg/dL (ref 0.0–40.0)

## 2015-08-07 LAB — CBC WITH DIFFERENTIAL/PLATELET
Basophils Absolute: 0 10*3/uL (ref 0.0–0.1)
Basophils Relative: 0.3 % (ref 0.0–3.0)
Eosinophils Absolute: 0.2 10*3/uL (ref 0.0–0.7)
Eosinophils Relative: 3.1 % (ref 0.0–5.0)
HCT: 41.1 % (ref 36.0–46.0)
Hemoglobin: 13.9 g/dL (ref 12.0–15.0)
Lymphocytes Relative: 19.9 % (ref 12.0–46.0)
Lymphs Abs: 1.5 10*3/uL (ref 0.7–4.0)
MCHC: 33.9 g/dL (ref 30.0–36.0)
MCV: 86.7 fl (ref 78.0–100.0)
Monocytes Absolute: 0.5 10*3/uL (ref 0.1–1.0)
Monocytes Relative: 6.5 % (ref 3.0–12.0)
Neutro Abs: 5.2 10*3/uL (ref 1.4–7.7)
Neutrophils Relative %: 70.2 % (ref 43.0–77.0)
Platelets: 438 10*3/uL — ABNORMAL HIGH (ref 150.0–400.0)
RBC: 4.74 Mil/uL (ref 3.87–5.11)
RDW: 14.1 % (ref 11.5–15.5)
WBC: 7.5 10*3/uL (ref 4.0–10.5)

## 2015-08-07 LAB — TSH: TSH: 2.49 u[IU]/mL (ref 0.35–4.50)

## 2015-08-07 LAB — VITAMIN D 25 HYDROXY (VIT D DEFICIENCY, FRACTURES): VITD: 33.91 ng/mL (ref 30.00–100.00)

## 2015-08-12 ENCOUNTER — Ambulatory Visit (INDEPENDENT_AMBULATORY_CARE_PROVIDER_SITE_OTHER): Payer: Managed Care, Other (non HMO) | Admitting: Family Medicine

## 2015-08-12 ENCOUNTER — Encounter: Payer: Self-pay | Admitting: Family Medicine

## 2015-08-12 VITALS — BP 106/60 | HR 98 | Temp 99.1°F | Ht 68.0 in | Wt 210.0 lb

## 2015-08-12 DIAGNOSIS — R232 Flushing: Secondary | ICD-10-CM

## 2015-08-12 DIAGNOSIS — F329 Major depressive disorder, single episode, unspecified: Secondary | ICD-10-CM

## 2015-08-12 DIAGNOSIS — N951 Menopausal and female climacteric states: Secondary | ICD-10-CM

## 2015-08-12 DIAGNOSIS — R739 Hyperglycemia, unspecified: Secondary | ICD-10-CM | POA: Diagnosis not present

## 2015-08-12 DIAGNOSIS — E559 Vitamin D deficiency, unspecified: Secondary | ICD-10-CM

## 2015-08-12 DIAGNOSIS — F418 Other specified anxiety disorders: Secondary | ICD-10-CM

## 2015-08-12 DIAGNOSIS — F32A Depression, unspecified: Secondary | ICD-10-CM

## 2015-08-12 MED ORDER — BUPROPION HCL ER (XL) 150 MG PO TB24: 150.0000 mg | ORAL_TABLET | Freq: Every day | ORAL | Status: DC

## 2015-08-12 MED ORDER — ESCITALOPRAM OXALATE 10 MG PO TABS: 10.0000 mg | ORAL_TABLET | Freq: Every day | ORAL | Status: DC

## 2015-08-12 MED ORDER — ESCITALOPRAM OXALATE 20 MG PO TABS: 20.0000 mg | ORAL_TABLET | Freq: Every day | ORAL | Status: DC

## 2015-08-12 NOTE — Patient Instructions (Signed)

## 2015-08-12 NOTE — Progress Notes (Signed)
Pre visit review using our clinic review tool, if applicable. No additional management support is needed unless otherwise documented below in the visit note. 

## 2015-08-13 LAB — CBC
HCT: 40.2 % (ref 36.0–46.0)
Hemoglobin: 13.6 g/dL (ref 12.0–15.0)
MCHC: 33.9 g/dL (ref 30.0–36.0)
MCV: 87.2 fl (ref 78.0–100.0)
Platelets: 449 10*3/uL — ABNORMAL HIGH (ref 150.0–400.0)
RBC: 4.61 Mil/uL (ref 3.87–5.11)
RDW: 13.8 % (ref 11.5–15.5)
WBC: 8 10*3/uL (ref 4.0–10.5)

## 2015-08-13 LAB — COMPREHENSIVE METABOLIC PANEL
ALT: 23 U/L (ref 0–35)
AST: 21 U/L (ref 0–37)
Albumin: 4.1 g/dL (ref 3.5–5.2)
Alkaline Phosphatase: 57 U/L (ref 39–117)
BUN: 15 mg/dL (ref 6–23)
CO2: 24 mEq/L (ref 19–32)
Calcium: 9.5 mg/dL (ref 8.4–10.5)
Chloride: 107 mEq/L (ref 96–112)
Creatinine, Ser: 0.75 mg/dL (ref 0.40–1.20)
GFR: 87.63 mL/min (ref >60.00)
Glucose, Bld: 118 mg/dL — ABNORMAL HIGH (ref 70–99)
Potassium: 4.5 mEq/L (ref 3.5–5.1)
Sodium: 140 mEq/L (ref 135–145)
Total Bilirubin: 0.3 mg/dL (ref 0.2–1.2)
Total Protein: 6.8 g/dL (ref 6.0–8.3)

## 2015-08-13 LAB — TSH: TSH: 2.59 u[IU]/mL (ref 0.35–4.50)

## 2015-08-13 LAB — LIPID PANEL
Cholesterol: 164 mg/dL (ref 0–200)
HDL: 44.3 mg/dL (ref >39.00)
LDL Cholesterol: 96 mg/dL (ref 0–99)
NonHDL: 119.8
Total CHOL/HDL Ratio: 4
Triglycerides: 117 mg/dL (ref 0.0–149.0)
VLDL: 23.4 mg/dL (ref 0.0–40.0)

## 2015-08-13 LAB — VITAMIN D 25 HYDROXY (VIT D DEFICIENCY, FRACTURES): VITD: 25.94 ng/mL — ABNORMAL LOW (ref 30.00–100.00)

## 2015-08-13 LAB — HEMOGLOBIN A1C: Hgb A1c MFr Bld: 6.2 % (ref 4.6–6.5)

## 2015-08-14 ENCOUNTER — Encounter: Payer: Self-pay | Admitting: Family Medicine

## 2015-08-15 ENCOUNTER — Other Ambulatory Visit: Payer: Self-pay | Admitting: Family Medicine

## 2015-08-15 ENCOUNTER — Encounter: Payer: Self-pay | Admitting: Family Medicine

## 2015-08-15 MED ORDER — VITAMIN D (ERGOCALCIFEROL) 1.25 MG (50000 UNIT) PO CAPS: 50000.0000 [IU] | ORAL_CAPSULE | ORAL | Status: DC

## 2015-08-18 DIAGNOSIS — R232 Flushing: Secondary | ICD-10-CM | POA: Insufficient documentation

## 2015-08-18 DIAGNOSIS — E559 Vitamin D deficiency, unspecified: Secondary | ICD-10-CM | POA: Insufficient documentation

## 2015-08-18 NOTE — Assessment & Plan Note (Signed)
Avoid alcohol and carbohydrates. Increase exercise, get regular sleep and SSRI

## 2015-08-18 NOTE — Progress Notes (Signed)
Patient ID: Cynthia Walters, female   DOB: 12/12/1965, 50 y.o.   MRN: [id number]   Subjective:    Patient ID: Cynthia Walters, female    DOB: 02/13/1966, 50 y.o.   MRN: [id number]  Chief Complaint  Patient presents with  . Follow-up    HPI Patient is in today for follow up. She is feeling tired and struggling with some heat intolerance. No recent illness or acute concerns.Denies CP/palp/SOB/HA/congestion/fevers/GI or GU c/o. Taking meds as prescribed  Past Medical History  Diagnosis Date  . Anxiety   . Depression   . Rosacea   . Vitamin D deficiency   . Vitamin B12 deficiency   . Hematuria   . Hyperglycemia 03/19/2015  . Depression with anxiety 01/08/2014    Past Surgical History  Procedure Laterality Date  . Cesarean section  2000  . Wisdom tooth extraction  age 17  . Dental surgery  01/2011    tooth removed,implant    Family History  Problem Relation Age of Onset  . Thyroid disease Mother   . Depression Father   . Mental illness Father     Bipolar  . Heart disease Maternal Grandmother   . Stroke Paternal Grandmother   . Diabetes Paternal Grandmother   . Stroke Paternal Grandfather   . Depression Brother   . Depression Brother     Social History   Social History  . Marital Status: Divorced    Spouse Name: N/A  . Number of Children: 1  . Years of Education: 16   Occupational History  . accountant    Social History Main Topics  . Smoking status: Never Smoker   . Smokeless tobacco: Never Used  . Alcohol Use: 3.0 oz/week    5 Glasses of wine per week     Comment: occassional wine with dinner  . Drug Use: No  . Sexual Activity:    Partners: Male    Birth Control/ Protection: Pill, Surgical     Comment: vasectomy   Other Topics Concern  . Not on file   Social History Narrative   Marital status:  Divorced, Dating x 5 years; no abuse.      Children: one son (69 yo)      lives with:   71 year old son and her boyfriend.      Employment:  Optometrist.  At same firm for 17 years. Feels like depression gets in the way.      Tobacco:  No tobacco.      Alcohol:  Beer/wine occasionally      Exercise:  Not currently exercising    Outpatient Prescriptions Prior to Visit  Medication Sig Dispense Refill  . cholecalciferol (VITAMIN D) 1000 UNITS tablet Take 1,000 Units by mouth daily.    . Norethindrone-Ethinyl Estradiol-Fe Biphas (LO LOESTRIN FE) 1 MG-10 MCG / 10 MCG tablet Take 1 tablet by mouth daily. 3 Package 0  . buPROPion (WELLBUTRIN XL) 150 MG 24 hr tablet Take 1 tablet (150 mg total) by mouth daily. 30 tablet 2  . escitalopram (LEXAPRO) 10 MG tablet Take 1 tablet (10 mg total) by mouth daily. 30 tablet 3  . Cyanocobalamin (VITAMIN B 12 PO) Take by mouth. Reported on 08/12/2015     No facility-administered medications prior to visit.    No Known Allergies  Review of Systems  Constitutional: Negative for fever and malaise/fatigue.  HENT: Negative for congestion.   Eyes: Negative for blurred vision.  Respiratory: Negative for shortness of breath.  Cardiovascular: Negative for chest pain, palpitations and leg swelling.  Gastrointestinal: Negative for nausea, abdominal pain and blood in stool.  Genitourinary: Negative for dysuria and frequency.  Musculoskeletal: Negative for falls.  Skin: Negative for rash.  Neurological: Negative for dizziness, loss of consciousness and headaches.  Endo/Heme/Allergies: Negative for environmental allergies.  Psychiatric/Behavioral: Negative for depression. The patient is not nervous/anxious.        Objective:    Physical Exam  Constitutional: She is oriented to person, place, and time. She appears well-developed and well-nourished. No distress.  HENT:  Head: Normocephalic and atraumatic.  Nose: Nose normal.  Eyes: Right eye exhibits no discharge. Left eye exhibits no discharge.  Neck: Normal range of motion. Neck supple.  Cardiovascular: Normal rate and regular rhythm.   No murmur  heard. Pulmonary/Chest: Effort normal and breath sounds normal.  Abdominal: Soft. Bowel sounds are normal. There is no tenderness.  Musculoskeletal: She exhibits no edema.  Neurological: She is alert and oriented to person, place, and time.  Skin: Skin is warm and dry.  Psychiatric: She has a normal mood and affect.  Nursing note and vitals reviewed.   BP 106/60 mmHg  Pulse 98  Temp(Src) 99.1 F (37.3 C) (Oral)  Ht 5\' 8"  (1.727 m)  Wt 210 lb (95.255 kg)  BMI 31.94 kg/m2  SpO2 98% Wt Readings from Last 3 Encounters:  08/12/15 210 lb (95.255 kg)  07/11/15 210 lb (95.255 kg)  07/09/15 207 lb (93.895 kg)     Lab Results  Component Value Date   WBC 8.0 08/12/2015   HGB 13.6 08/12/2015   HCT 40.2 08/12/2015   PLT 449.0* 08/12/2015   GLUCOSE 118* 08/12/2015   CHOL 164 08/12/2015   TRIG 117.0 08/12/2015   HDL 44.30 08/12/2015   LDLCALC 96 08/12/2015   ALT 23 08/12/2015   AST 21 08/12/2015   NA 140 08/12/2015   K 4.5 08/12/2015   CL 107 08/12/2015   CREATININE 0.75 08/12/2015   BUN 15 08/12/2015   CO2 24 08/12/2015   TSH 2.59 08/12/2015   HGBA1C 6.2 08/12/2015    Lab Results  Component Value Date   TSH 2.59 08/12/2015   Lab Results  Component Value Date   WBC 8.0 08/12/2015   HGB 13.6 08/12/2015   HCT 40.2 08/12/2015   MCV 87.2 08/12/2015   PLT 449.0* 08/12/2015   Lab Results  Component Value Date   NA 140 08/12/2015   K 4.5 08/12/2015   CO2 24 08/12/2015   GLUCOSE 118* 08/12/2015   BUN 15 08/12/2015   CREATININE 0.75 08/12/2015   BILITOT 0.3 08/12/2015   ALKPHOS 57 08/12/2015   AST 21 08/12/2015   ALT 23 08/12/2015   PROT 6.8 08/12/2015   ALBUMIN 4.1 08/12/2015   CALCIUM 9.5 08/12/2015   GFR 87.63 08/12/2015   Lab Results  Component Value Date   CHOL 164 08/12/2015   Lab Results  Component Value Date   HDL 44.30 08/12/2015   Lab Results  Component Value Date   LDLCALC 96 08/12/2015   Lab Results  Component Value Date   TRIG 117.0  08/12/2015   Lab Results  Component Value Date   CHOLHDL 4 08/12/2015   Lab Results  Component Value Date   HGBA1C 6.2 08/12/2015       Assessment & Plan:   Problem List Items Addressed This Visit    Vitamin D deficiency    Labs reveal deficiency. Start on Vitamin D 50000 IU caps, 1 cap  po weekly x 12 weeks. Disp #4 with 4 rf. Also take daily Vitamin D over the counter. If already taking a daily supplement increase by 1000 IU daily and if not start Vitamin D 2000 IU daily.       Relevant Orders   Hemoglobin A1c (Completed)   VITAMIN D 25 Hydroxy (Vit-D Deficiency, Fractures) (Completed)   TSH (Completed)   CBC (Completed)   Hemoglobin A1c   Lipid panel (Completed)   Comprehensive metabolic panel (Completed)   Hyperglycemia    hgba1c acceptable, minimize simple carbs. Increase exercise as tolerated.      Relevant Orders   Hemoglobin A1c (Completed)   VITAMIN D 25 Hydroxy (Vit-D Deficiency, Fractures) (Completed)   TSH (Completed)   CBC (Completed)   Hemoglobin A1c   Lipid panel (Completed)   Comprehensive metabolic panel (Completed)   Hot flashes    Avoid alcohol and carbohydrates. Increase exercise, get regular sleep and SSRI      Relevant Orders   Hemoglobin A1c (Completed)   VITAMIN D 25 Hydroxy (Vit-D Deficiency, Fractures) (Completed)   TSH (Completed)   CBC (Completed)   Hemoglobin A1c   Lipid panel (Completed)   Comprehensive metabolic panel (Completed)   Depression with anxiety    Struggling with stress and perimenopausal heat intolerance. Will try Lexapro and Wellbutrin.      Relevant Medications   buPROPion (WELLBUTRIN XL) 150 MG 24 hr tablet    Other Visit Diagnoses    Depression    -  Primary    Relevant Medications    buPROPion (WELLBUTRIN XL) 150 MG 24 hr tablet    escitalopram (LEXAPRO) 20 MG tablet    Other Relevant Orders    Hemoglobin A1c (Completed)    VITAMIN D 25 Hydroxy (Vit-D Deficiency, Fractures) (Completed)    TSH (Completed)     CBC (Completed)    Hemoglobin A1c    Lipid panel (Completed)    Comprehensive metabolic panel (Completed)       I have discontinued Ms. Angeloff escitalopram and escitalopram. I am also having her start on escitalopram. Additionally, I am having her maintain her Cyanocobalamin (VITAMIN B 12 PO), cholecalciferol, Norethindrone-Ethinyl Estradiol-Fe Biphas, and buPROPion.  Meds ordered this encounter  Medications  . DISCONTD: escitalopram (LEXAPRO) 10 MG tablet    Sig: Take 1 tablet (10 mg total) by mouth daily.    Dispense:  90 tablet    Refill:  2  . buPROPion (WELLBUTRIN XL) 150 MG 24 hr tablet    Sig: Take 1 tablet (150 mg total) by mouth daily.    Dispense:  90 tablet    Refill:  2  . escitalopram (LEXAPRO) 20 MG tablet    Sig: Take 1 tablet (20 mg total) by mouth daily.    Dispense:  30 tablet    Refill:  5     Penni Homans, MD

## 2015-08-18 NOTE — Assessment & Plan Note (Signed)
Struggling with stress and perimenopausal heat intolerance. Will try Lexapro and Wellbutrin.

## 2015-08-18 NOTE — Assessment & Plan Note (Signed)
Labs reveal deficiency. Start on Vitamin D 50000 IU caps, 1 cap po weekly x 12 weeks. Disp #4 with 4 rf. Also take daily Vitamin D over the counter. If already taking a daily supplement increase by 1000 IU daily and if not start Vitamin D 2000 IU daily.  

## 2015-08-18 NOTE — Assessment & Plan Note (Signed)
hgba1c acceptable, minimize simple carbs. Increase exercise as tolerated.  

## 2015-08-22 ENCOUNTER — Encounter: Payer: Self-pay | Admitting: Family Medicine

## 2015-08-22 ENCOUNTER — Other Ambulatory Visit: Payer: Self-pay | Admitting: Family Medicine

## 2015-10-02 ENCOUNTER — Encounter: Payer: Self-pay | Admitting: Family Medicine

## 2015-10-02 MED ORDER — ESCITALOPRAM OXALATE 20 MG PO TABS
20.0000 mg | ORAL_TABLET | Freq: Every day | ORAL | Status: DC
Start: 2015-10-02 — End: 2015-11-29

## 2015-10-02 NOTE — Telephone Encounter (Signed)
Lexapro 20 mg 1 tablet daily re-sent to [facility].

## 2015-10-08 ENCOUNTER — Other Ambulatory Visit: Payer: Self-pay | Admitting: Nurse Practitioner

## 2015-10-09 NOTE — Telephone Encounter (Signed)
Medication refill request: OCP Last AEX:  07-09-15  Next AEX: 07-10-16 Last MMG (if hormonal medication request): 07-12-15 WNL Refill authorized: please advise

## 2015-10-17 ENCOUNTER — Ambulatory Visit: Payer: Managed Care, Other (non HMO) | Admitting: Family Medicine

## 2015-11-08 ENCOUNTER — Other Ambulatory Visit: Payer: Self-pay | Admitting: Family Medicine

## 2015-11-29 ENCOUNTER — Ambulatory Visit (INDEPENDENT_AMBULATORY_CARE_PROVIDER_SITE_OTHER): Payer: Managed Care, Other (non HMO) | Admitting: Family Medicine

## 2015-11-29 ENCOUNTER — Encounter: Payer: Self-pay | Admitting: Family Medicine

## 2015-11-29 DIAGNOSIS — F418 Other specified anxiety disorders: Secondary | ICD-10-CM

## 2015-11-29 DIAGNOSIS — E782 Mixed hyperlipidemia: Secondary | ICD-10-CM | POA: Diagnosis not present

## 2015-11-29 DIAGNOSIS — E559 Vitamin D deficiency, unspecified: Secondary | ICD-10-CM

## 2015-11-29 DIAGNOSIS — E669 Obesity, unspecified: Secondary | ICD-10-CM | POA: Diagnosis not present

## 2015-11-29 DIAGNOSIS — R739 Hyperglycemia, unspecified: Secondary | ICD-10-CM

## 2015-11-29 HISTORY — DX: Obesity, unspecified: E66.9

## 2015-11-29 HISTORY — DX: Mixed hyperlipidemia: E78.2

## 2015-11-29 LAB — LIPID PANEL
Cholesterol: 169 mg/dL (ref 0–200)
HDL: 56.3 mg/dL (ref >39.00)
LDL Cholesterol: 95 mg/dL (ref 0–99)
NonHDL: 112.49
Total CHOL/HDL Ratio: 3
Triglycerides: 87 mg/dL (ref 0.0–149.0)
VLDL: 17.4 mg/dL (ref 0.0–40.0)

## 2015-11-29 LAB — CBC
HCT: 42.3 % (ref 36.0–46.0)
Hemoglobin: 14.3 g/dL (ref 12.0–15.0)
MCHC: 33.9 g/dL (ref 30.0–36.0)
MCV: 87.8 fl (ref 78.0–100.0)
Platelets: 426 10*3/uL — ABNORMAL HIGH (ref 150.0–400.0)
RBC: 4.81 Mil/uL (ref 3.87–5.11)
RDW: 13.4 % (ref 11.5–15.5)
WBC: 7.8 10*3/uL (ref 4.0–10.5)

## 2015-11-29 LAB — COMPREHENSIVE METABOLIC PANEL
ALT: 24 U/L (ref 0–35)
AST: 18 U/L (ref 0–37)
Albumin: 4.1 g/dL (ref 3.5–5.2)
Alkaline Phosphatase: 59 U/L (ref 39–117)
BUN: 12 mg/dL (ref 6–23)
CO2: 27 mEq/L (ref 19–32)
Calcium: 9 mg/dL (ref 8.4–10.5)
Chloride: 106 mEq/L (ref 96–112)
Creatinine, Ser: 0.68 mg/dL (ref 0.40–1.20)
GFR: 98 mL/min (ref >60.00)
Glucose, Bld: 114 mg/dL — ABNORMAL HIGH (ref 70–99)
Potassium: 4.6 mEq/L (ref 3.5–5.1)
Sodium: 139 mEq/L (ref 135–145)
Total Bilirubin: 0.5 mg/dL (ref 0.2–1.2)
Total Protein: 7.2 g/dL (ref 6.0–8.3)

## 2015-11-29 LAB — TSH: TSH: 2.31 u[IU]/mL (ref 0.35–4.50)

## 2015-11-29 LAB — HEMOGLOBIN A1C: Hgb A1c MFr Bld: 6.1 % (ref 4.6–6.5)

## 2015-11-29 MED ORDER — FLUOXETINE HCL 20 MG PO TABS: 20.0000 mg | ORAL_TABLET | Freq: Every day | ORAL | 3 refills | Status: DC

## 2015-11-29 NOTE — Progress Notes (Signed)
Pre visit review using our clinic review tool, if applicable. No additional management support is needed unless otherwise documented below in the visit note. 

## 2015-11-29 NOTE — Patient Instructions (Signed)

## 2015-11-29 NOTE — Assessment & Plan Note (Addendum)
Takes Wellbutrin and Lexapro but still struggling with anhedonia. D/c lexapro and start Fluoxetine 20 mg daily.

## 2015-11-29 NOTE — Assessment & Plan Note (Signed)
hgba1c acceptable, minimize simple carbs. Increase exercise as tolerated.  

## 2015-11-29 NOTE — Assessment & Plan Note (Signed)
Encouraged DASH diet, decrease po intake and increase exercise as tolerated. Needs 7-8 hours of sleep nightly. Avoid trans fats, eat small, frequent meals every 4-5 hours with lean proteins, complex carbs and healthy fats. Minimize simple carbs, GMO foods. 

## 2015-11-29 NOTE — Assessment & Plan Note (Signed)
Taking 50000 IU weekly and 2000 IU daily will check level today

## 2015-12-09 ENCOUNTER — Encounter: Payer: Self-pay | Admitting: Family Medicine

## 2015-12-12 ENCOUNTER — Other Ambulatory Visit: Payer: Self-pay | Admitting: Family Medicine

## 2015-12-12 DIAGNOSIS — E559 Vitamin D deficiency, unspecified: Secondary | ICD-10-CM

## 2015-12-15 NOTE — Assessment & Plan Note (Signed)
Encouraged heart healthy diet, increase exercise, avoid trans fats, consider a krill oil cap daily 

## 2015-12-15 NOTE — Progress Notes (Signed)
Patient ID: Cynthia Walters, female   DOB: [birth year], 50 y.o.   MRN: [id number]   Subjective:    Patient ID: Cynthia Walters, female    DOB: 02/02/1966, 50 y.o.   MRN: [id number]  Chief Complaint  Patient presents with  . Follow-up    HPI Patient is in today for follow up. She is feeling well today. No recent illness or hospitalization. Does note some frustration with fatigue that she feels is related to her medications. Denies CP/palp/SOB/HA/congestion/fevers/GI or GU c/o. Taking meds as prescribed  Past Medical History:  Diagnosis Date  . Anxiety   . Depression   . Depression with anxiety 01/08/2014  . Hematuria   . Hyperglycemia 03/19/2015  . Hyperlipidemia, mixed 11/29/2015  . Obesity 11/29/2015  . Rosacea   . Vitamin B12 deficiency   . Vitamin D deficiency     Past Surgical History:  Procedure Laterality Date  . CESAREAN SECTION  2000  . DENTAL SURGERY  01/2011   tooth removed,implant  . WISDOM TOOTH EXTRACTION  age 30    Family History  Problem Relation Age of Onset  . Thyroid disease Mother   . Depression Father   . Mental illness Father     Bipolar  . Heart disease Maternal Grandmother   . Stroke Paternal Grandmother   . Diabetes Paternal Grandmother   . Stroke Paternal Grandfather   . Depression Brother   . Depression Brother     Social History   Social History  . Marital status: Divorced    Spouse name: N/A  . Number of children: 1  . 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 Smoking status: Never Smoker  . Smokeless tobacco: Never Used  . Alcohol use 3.0 oz/week    5 Glasses of wine per week     Comment: occassional wine with dinner  . Drug use: No  . Sexual activity: Yes    Partners: Male    Birth control/ protection: Pill, Surgical     Comment: vasectomy   Other Topics Concern  . Not on file   Social History Narrative   Marital status:  Divorced, Dating x 5  years; no abuse.      Children: one son (2 yo)      lives with:   70 year old son and her boyfriend.      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      Tobacco:  No tobacco.      Alcohol:  Beer/wine occasionally      Exercise:  Not currently exercising    Outpatient Medications Prior to Visit  Medication Sig Dispense Refill  . buPROPion (WELLBUTRIN XL) 150 MG 24 hr tablet Take 1 tablet (150 mg total) by mouth daily. 90 tablet 2  . cholecalciferol (VITAMIN D) 1000 UNITS tablet Take 1,000 Units by mouth daily.    . Cyanocobalamin (VITAMIN B 12 PO) Take by mouth. Reported on 08/12/2015    . LO LOESTRIN FE 1 MG-10 MCG / 10 MCG tablet TAKE 1 TABLET BY MOUTH DAILY. 84 tablet 3  . Vitamin D, Ergocalciferol, (DRISDOL) 50000 units CAPS capsule TAKE 1 CAPSULE BY MOUTH EVERY 7 DAYS 12 capsule 0  . escitalopram (LEXAPRO) 20 MG tablet Take 1 tablet (20 mg total) by mouth daily. 30 tablet 5  . Vitamin D, Ergocalciferol, (DRISDOL) 50000 units  CAPS capsule Take 1 capsule (50,000 Units total) by mouth every 7 (seven) days. 12 capsule 0   No facility-administered medications prior to visit.     No Known Allergies  Review of Systems  Constitutional: Positive for malaise/fatigue. Negative for fever.  HENT: Negative for congestion.   Eyes: Negative for blurred vision.  Respiratory: Negative for shortness of breath.   Cardiovascular: Negative for chest pain, palpitations and leg swelling.  Gastrointestinal: Negative for abdominal pain, blood in stool and nausea.  Genitourinary: Negative for dysuria and frequency.  Musculoskeletal: Negative for falls.  Skin: Negative for rash.  Neurological: Negative for dizziness, loss of consciousness and headaches.  Endo/Heme/Allergies: Negative for environmental allergies.  Psychiatric/Behavioral: Negative for depression. The patient is not nervous/anxious.        Objective:    Physical Exam  Constitutional:  She is oriented to person, place, and time. She appears well-developed and well-nourished. No distress.  HENT:  Head: Normocephalic and atraumatic.  Nose: Nose normal.  Eyes: Right eye exhibits no discharge. Left eye exhibits no discharge.  Neck: Normal range of motion. Neck supple.  Cardiovascular: Normal rate and regular rhythm.   No murmur heard. Pulmonary/Chest: Effort normal and breath sounds normal.  Abdominal: Soft. Bowel sounds are normal. There is no tenderness.  Musculoskeletal: She exhibits no edema.  Neurological: She is alert and oriented to person, place, and time.  Skin: Skin is warm and dry.  Psychiatric: She has a normal mood and affect.  Nursing note and vitals reviewed.   BP 112/78 (BP Location: Left Arm, Patient Position: Sitting, Cuff Size: Normal)   Pulse 87   Temp 97.8 F (36.6 C) (Oral)   Wt 212 lb 12.8 oz (96.5 kg)   LMP 11/15/2015 (Exact Date)   SpO2 96%   BMI 32.36 kg/m  Wt Readings from Last 3 Encounters:  11/29/15 212 lb 12.8 oz (96.5 kg)  08/12/15 210 lb (95.3 kg)  07/11/15 210 lb (95.3 kg)     Lab Results  Component Value Date   WBC 7.8 11/29/2015   HGB 14.3 11/29/2015   HCT 42.3 11/29/2015   PLT 426.0 (H) 11/29/2015   GLUCOSE 114 (H) 11/29/2015   CHOL 169 11/29/2015   TRIG 87.0 11/29/2015   HDL 56.30 11/29/2015   LDLCALC 95 11/29/2015   ALT 24 11/29/2015   AST 18 11/29/2015   NA 139 11/29/2015   K 4.6 11/29/2015   CL 106 11/29/2015   CREATININE 0.68 11/29/2015   BUN 12 11/29/2015   CO2 27 11/29/2015   TSH 2.31 11/29/2015   HGBA1C 6.1 11/29/2015    Lab Results  Component Value Date   TSH 2.31 11/29/2015   Lab Results  Component Value Date   WBC 7.8 11/29/2015   HGB 14.3 11/29/2015   HCT 42.3 11/29/2015   MCV 87.8 11/29/2015   PLT 426.0 (H) 11/29/2015   Lab Results  Component Value Date   NA 139 11/29/2015   K 4.6 11/29/2015   CO2 27 11/29/2015   GLUCOSE 114 (H) 11/29/2015   BUN 12 11/29/2015   CREATININE 0.68  11/29/2015   BILITOT 0.5 11/29/2015   ALKPHOS 59 11/29/2015   AST 18 11/29/2015   ALT 24 11/29/2015   PROT 7.2 11/29/2015   ALBUMIN 4.1 11/29/2015   CALCIUM 9.0 11/29/2015   GFR 98.00 11/29/2015   Lab Results  Component Value Date   CHOL 169 11/29/2015   Lab Results  Component Value Date   HDL 56.30 11/29/2015  Lab Results  Component Value Date   LDLCALC 95 11/29/2015   Lab Results  Component Value Date   TRIG 87.0 11/29/2015   Lab Results  Component Value Date   CHOLHDL 3 11/29/2015   Lab Results  Component Value Date   HGBA1C 6.1 11/29/2015       Assessment & Plan:   Problem List Items Addressed This Visit    Depression with anxiety    Takes Wellbutrin and Lexapro but still struggling with anhedonia. D/c lexapro and start Fluoxetine 20 mg daily.       Relevant Orders   CBC (Completed)   Hyperglycemia    hgba1c acceptable, minimize simple carbs. Increase exercise as tolerated.       Relevant Orders   TSH (Completed)   CBC (Completed)   Hemoglobin A1c (Completed)   Vitamin D deficiency    Taking 50000 IU weekly and 2000 IU daily will check level today      Relevant Orders   CBC (Completed)   Comprehensive metabolic panel (Completed)   Obesity    Encouraged DASH diet, decrease po intake and increase exercise as tolerated. Needs 7-8 hours of sleep nightly. Avoid trans fats, eat small, frequent meals every 4-5 hours with lean proteins, complex carbs and healthy fats. Minimize simple carbs, GMO foods.      Relevant Orders   CBC (Completed)   Hyperlipidemia, mixed    Encouraged heart healthy diet, increase exercise, avoid trans fats, consider a krill oil cap daily      Relevant Orders   TSH (Completed)   CBC (Completed)   Lipid panel (Completed)    Other Visit Diagnoses   None.     I have discontinued Ms. Haner escitalopram. I am also having her start on FLUoxetine. Additionally, I am having her maintain her Cyanocobalamin (VITAMIN B 12  PO), cholecalciferol, buPROPion, LO LOESTRIN FE, and Vitamin D (Ergocalciferol).  Meds ordered this encounter  Medications  . FLUoxetine (PROZAC) 20 MG tablet    Sig: Take 1 tablet (20 mg total) by mouth daily.    Dispense:  30 tablet    Refill:  3     Penni Homans, MD

## 2016-01-08 ENCOUNTER — Encounter: Payer: Self-pay | Admitting: Family Medicine

## 2016-02-14 ENCOUNTER — Ambulatory Visit: Payer: Managed Care, Other (non HMO)

## 2016-02-19 ENCOUNTER — Ambulatory Visit (INDEPENDENT_AMBULATORY_CARE_PROVIDER_SITE_OTHER): Payer: Managed Care, Other (non HMO) | Admitting: Behavioral Health

## 2016-02-19 ENCOUNTER — Telehealth: Payer: Self-pay | Admitting: Family Medicine

## 2016-02-19 DIAGNOSIS — Z23 Encounter for immunization: Secondary | ICD-10-CM

## 2016-02-19 NOTE — Telephone Encounter (Signed)
Patient is questioning her need for the labs for dos: 11/29/15 she said she was tested for these issues later in the year and may and December of 2016. She wants to know why labs: Comprehensive metabolic panel, Lipid panel, TSH was ordered again. Patient does not feel she should have to pay for the labs because she did not need them. She said she was told she would only be having 3 labs done at this appointment but a full panel was done.   Billing has reviewed and it is consistent with what she had done so they have informed patient she is responsible for the payment.    Please advise patient on the medical need for the labs that was ordered.    Patient call back number   [phone]

## 2016-02-24 NOTE — Telephone Encounter (Signed)
Because sugar was up recheck cmp with hgba1c and lipid, tsh and cbc for depression. Also platelets were up and needed to make sure they did not trend back up. Does she have a high deductible plan or do I need to recode something because her insurance declined to pay?

## 2016-03-10 NOTE — Telephone Encounter (Signed)
°  Update:  Called patient and informed her of the message below. Told patient she is responsible for the bill. Per billing it is coded correctly and consistent with service provider provided. Patient was receptive to the information. Patient asked that in the future before her test are ran that she get a run down of what they are for because she has a high deductible plan that will only cover these labs if they are a part of the wellness visit she gets.  I told patient I would relay the message to the her provider and informed her to make sure she checks with her insurance for coverage for her future visits.

## 2016-07-10 ENCOUNTER — Ambulatory Visit: Payer: Managed Care, Other (non HMO) | Admitting: Nurse Practitioner

## 2016-07-21 ENCOUNTER — Ambulatory Visit (INDEPENDENT_AMBULATORY_CARE_PROVIDER_SITE_OTHER): Payer: Managed Care, Other (non HMO) | Admitting: Nurse Practitioner

## 2016-07-21 ENCOUNTER — Encounter: Payer: Self-pay | Admitting: Nurse Practitioner

## 2016-07-21 VITALS — BP 120/74 | HR 92 | Ht 67.25 in | Wt 215.0 lb

## 2016-07-21 DIAGNOSIS — E559 Vitamin D deficiency, unspecified: Secondary | ICD-10-CM | POA: Diagnosis not present

## 2016-07-21 DIAGNOSIS — E538 Deficiency of other specified B group vitamins: Secondary | ICD-10-CM

## 2016-07-21 DIAGNOSIS — Z0000 Encounter for general adult medical examination without abnormal findings: Secondary | ICD-10-CM

## 2016-07-21 DIAGNOSIS — Z01419 Encounter for gynecological examination (general) (routine) without abnormal findings: Secondary | ICD-10-CM

## 2016-07-21 DIAGNOSIS — D251 Intramural leiomyoma of uterus: Secondary | ICD-10-CM | POA: Diagnosis not present

## 2016-07-21 DIAGNOSIS — E7439 Other disorders of intestinal carbohydrate absorption: Secondary | ICD-10-CM | POA: Diagnosis not present

## 2016-07-21 LAB — VITAMIN B12: Vitamin B-12: 328 pg/mL (ref 200–1100)

## 2016-07-21 MED ORDER — NORETHIN-ETH ESTRAD-FE BIPHAS 1 MG-10 MCG / 10 MCG PO TABS: 1.0000 | ORAL_TABLET | Freq: Every day | ORAL | 4 refills | Status: DC

## 2016-07-21 NOTE — Progress Notes (Signed)
51 y.o. G71P1001 Divorced  Caucasian Fe here for annual exam.  Menses now at spotting only with wiping X 2 days and then 2 weeks later spotting X 1 day.  These occurrences are rare.  Some breast tenderness.  Vaso symptoms:   Some night sweats without insomnia.  Warm flushes during the day.   She was evaluated for DUB with OCP's last year with Dr. Sabra Heck and had normal ultrasound except for intramural fibroids and endometrial calcifications.  Same partner for 6+ yrs.  In general she feels depressed and lacks motivation - she is working on changing that.  Previous antidepressants have not worked well for her - discussed with PCP.  Patient's last menstrual period was 06/30/2016 (exact date).          Sexually active: Yes.    The current method of family planning is vasectomy.    Exercising: No.  The patient does not participate in regular exercise at present. Smoker:  no  Health Maintenance: Pap: 07/03/14, Negative with neg HR HPV  04/30/11, Negative with neg HR HPV History of Abnormal Pap: no MMG: 07/12/15, 3D, Bi-Rads 1:  Negative will schedule Self Breast exams: not regularly TDaP:  09/16/11 HIV: 02/19/10 Labs: PCP in EPIC   reports that she has never smoked. She has never used smokeless tobacco. She reports that she drinks about 3.0 oz of alcohol per week . She reports that she does not use drugs.  Past Medical History:  Diagnosis Date  . Anxiety   . Depression   . Depression with anxiety 01/08/2014  . Hematuria   . Hyperglycemia 03/19/2015  . Hyperlipidemia, mixed 11/29/2015  . Obesity 11/29/2015  . Rosacea   . Vitamin B12 deficiency   . Vitamin D deficiency     Past Surgical History:  Procedure Laterality Date  . CESAREAN SECTION  2000  . DENTAL SURGERY  01/2011   tooth removed,implant  . WISDOM TOOTH EXTRACTION  age 51    Current Outpatient Prescriptions  Medication Sig Dispense Refill  . LO LOESTRIN FE 1 MG-10 MCG / 10 MCG tablet TAKE 1 TABLET BY MOUTH DAILY. 84 tablet 3  .  cholecalciferol (VITAMIN D) 1000 UNITS tablet Take 1,000 Units by mouth daily.    . Cyanocobalamin (VITAMIN B 12 PO) Take by mouth. Reported on 08/12/2015     No current facility-administered medications for this visit.     Family History  Problem Relation Age of Onset  . Thyroid disease Mother   . Depression Father   . Mental illness Father     Bipolar  . Heart disease Maternal Grandmother   . Stroke Paternal Grandmother   . Diabetes Paternal Grandmother   . Stroke Paternal Grandfather   . Depression Brother   . Depression Brother     ROS:  Pertinent items are noted in HPI.  Otherwise, a comprehensive ROS was negative.  Exam:   BP 120/74 (BP Location: Right Arm, Patient Position: Sitting, Cuff Size: Large)   Pulse 92   Ht 5' 7.25" (1.708 m)   Wt 215 lb (97.5 kg)   LMP 06/30/2016 (Exact Date)   BMI 33.42 kg/m  Height: 5' 7.25" (170.8 cm) Ht Readings from Last 3 Encounters:  07/21/16 5' 7.25" (1.708 m)  08/12/15 5\' 8"  (1.727 m)  07/11/15 5' 7.5" (1.715 m)    General appearance: alert, cooperative and appears stated age Head: Normocephalic, without obvious abnormality, atraumatic Neck: no adenopathy, supple, symmetrical, trachea midline and thyroid normal to inspection and palpation  Lungs: clear to auscultation bilaterally Breasts: normal appearance, no masses or tenderness Heart: regular rate and rhythm Abdomen: soft, non-tender; no masses,  no organomegaly Extremities: extremities normal, atraumatic, no cyanosis or edema Skin: Skin color, texture, turgor normal. No rashes or lesions Lymph nodes: Cervical, supraclavicular, and axillary nodes normal. No abnormal inguinal nodes palpated Neurologic: Grossly normal   Pelvic: External genitalia:  no lesions              Urethra:  normal appearing urethra with no masses, tenderness or lesions              Bartholin's and Skene's: normal                 Vagina: normal appearing vagina with normal color and discharge, no  lesions              Cervix: anteverted              Pap taken: No. Bimanual Exam:  Uterus:  normal size, contour, position, consistency, mobility, non-tender              Adnexa: no mass, fullness, tenderness               Rectovaginal: Confirms               Anus:  normal sphincter tone, no lesions  Chaperone present: yes  A:  Well Woman with normal exam  Vasectomy for birth control OCP for menorrhagia   History of uterine fibroids Situational depression and anxiety - followed by PCP Glucose intolerance, Vit B 12 def, Vit D def.              P:   Reviewed health and wellness pertinent to exam  Pap smear: no  Mammogram is due now and will schedule  Refill on OCP for a year  Counseled on breast self exam, mammography screening, adequate intake of calcium and vitamin D, diet and exercise return annually or prn  An After Visit Summary was printed and given to the patient.

## 2016-07-21 NOTE — Patient Instructions (Addendum)

## 2016-07-22 LAB — VITAMIN D 25 HYDROXY (VIT D DEFICIENCY, FRACTURES): Vit D, 25-Hydroxy: 29 ng/mL — ABNORMAL LOW (ref 30–100)

## 2016-07-22 LAB — HEMOGLOBIN A1C
Hgb A1c MFr Bld: 6 % — ABNORMAL HIGH (ref <5.7)
Mean Plasma Glucose: 126 mg/dL

## 2016-07-23 NOTE — Progress Notes (Signed)
Encounter reviewed by Dr. Emmer Lillibridge Amundson C. Silva.  

## 2016-10-20 ENCOUNTER — Telehealth: Payer: Self-pay | Admitting: *Deleted

## 2016-10-20 NOTE — Telephone Encounter (Signed)
Received request for Medical Records from [facility], forwarded to [place] for email/scana/SLS 07/24

## 2016-10-26 ENCOUNTER — Encounter: Payer: Self-pay | Admitting: Certified Nurse Midwife

## 2016-11-03 ENCOUNTER — Encounter: Payer: Managed Care, Other (non HMO) | Attending: Family Medicine | Admitting: Registered"

## 2016-11-03 ENCOUNTER — Encounter: Payer: Self-pay | Admitting: Registered"

## 2016-11-03 DIAGNOSIS — R7303 Prediabetes: Secondary | ICD-10-CM | POA: Insufficient documentation

## 2016-11-03 DIAGNOSIS — Z713 Dietary counseling and surveillance: Secondary | ICD-10-CM | POA: Insufficient documentation

## 2016-11-03 DIAGNOSIS — E669 Obesity, unspecified: Secondary | ICD-10-CM | POA: Insufficient documentation

## 2016-11-03 NOTE — Patient Instructions (Addendum)
Continue taking the vitamin D regularly Consider going to sleep 1/2 hr earlier and starting a bedtime routine 1 hour before bed, avoiding screens. Consider including more snacks to avoid extreme hunger Aim to eat balanced meats Think about pre-planning 2 meals per week

## 2016-11-03 NOTE — Progress Notes (Signed)
Medical Nutrition Therapy:  Appt start time: 1100 end time:  1200.   Assessment:  Primary concerns today: Pt states she has pre-diabetes (per chart A1c 6.0% 3 month ago) & would like to lose weight. Per chart patient Vit D levels indicate insufficiency. Pt states she took prescription Vit D for 2 months, stopped for 4 months, and for the last month has been taking consistently.  Sleep: 11 pm wake up at 3 am trouble going back to sleep, toss & turn.  Assessed for potential PCOS (RD doesn't think symptoms indicate need to investigate further) Taking OCP for painful heavy periods - years. Periods were okay, 2010 heavy & cramps. Thyroid checked - normal - mom had hypothyroid. Fatigue - antidepressant may have affected, 5 years Hair - no Acne - no Weight gain - stomach, face  Preferred Learning Style:   No preference indicated   Learning Readiness:   Ready  MEDICATIONS: reviewed   DIETARY INTAKE:  24-hr recall:  B ( AM): PB & crackers (not hungry, just to get through lunch) used to do boiled egg & toast Snk ( AM): none  L ( PM): fast food OR [employer] cafeteria Snk ( PM): none OR peanuts D ( PM): eating out (tries to eat healthy) Pizza OR [place] OR burger fries sometimes Snk ( PM): none OR something sweet (tries not to) Beverages: week water, ice tea 1/2 & 1/2 sweet/unsweet, (bad about) diet mt dew crave  Usual physical activity: ADLs not motivated to make time. Would rather be outside, but has been too hot.  Estimated energy needs: 1800 calories 200 g carbohydrates 135 g protein 50 g fat  Progress Towards Goal(s):  In progress.   Nutritional Diagnosis:  NB-1.1 Food and nutrition-related knowledge deficit As related to benefit of snacks.  As evidenced by regularly waiting until extreme hunger sets in to eat a meal..    Intervention:  Nutrition Education. Discussed balanced meals and purpose of snacks in-between. Discussed which foods can affect blood sugar and balancing with  other food groups. Discussed setting realistic goals. Discussed importance of getting enough quality sleep.  Plan: Continue taking the vitamin D regularly Consider going to sleep 1/2 hr earlier and starting a bedtime routine 1 hour before bed, avoiding screens. Consider including more snacks to avoid extreme hunger Aim to eat balanced meats Think about pre-planning 2 meals per week  Teaching Method Utilized:  Visual Auditory  Handouts given during visit include:  My Plate Planner  Snack sheet (I think)  Barriers to learning/adherence to lifestyle change: none  Demonstrated degree of understanding via:  Teach Back   Monitoring/Evaluation:  Dietary intake, exercise, and body weight prn.

## 2017-07-22 ENCOUNTER — Other Ambulatory Visit (HOSPITAL_COMMUNITY)
Admission: RE | Admit: 2017-07-22 | Discharge: 2017-07-22 | Disposition: A | Discharge status: Home / Self Care | Payer: Managed Care, Other (non HMO) | Source: Ambulatory Visit | Attending: Certified Nurse Midwife | Admitting: Certified Nurse Midwife

## 2017-07-22 ENCOUNTER — Ambulatory Visit (INDEPENDENT_AMBULATORY_CARE_PROVIDER_SITE_OTHER): Payer: Managed Care, Other (non HMO) | Admitting: Certified Nurse Midwife

## 2017-07-22 ENCOUNTER — Encounter: Payer: Self-pay | Admitting: Certified Nurse Midwife

## 2017-07-22 ENCOUNTER — Other Ambulatory Visit: Payer: Self-pay

## 2017-07-22 VITALS — BP 108/62 | HR 70 | Resp 16 | Ht 67.25 in | Wt 220.0 lb

## 2017-07-22 DIAGNOSIS — Z124 Encounter for screening for malignant neoplasm of cervix: Secondary | ICD-10-CM | POA: Diagnosis present

## 2017-07-22 DIAGNOSIS — R6889 Other general symptoms and signs: Secondary | ICD-10-CM | POA: Diagnosis not present

## 2017-07-22 DIAGNOSIS — Z1211 Encounter for screening for malignant neoplasm of colon: Secondary | ICD-10-CM

## 2017-07-22 DIAGNOSIS — Z01419 Encounter for gynecological examination (general) (routine) without abnormal findings: Secondary | ICD-10-CM | POA: Diagnosis not present

## 2017-07-22 DIAGNOSIS — N951 Menopausal and female climacteric states: Secondary | ICD-10-CM

## 2017-07-22 NOTE — Patient Instructions (Signed)

## 2017-07-22 NOTE — Progress Notes (Signed)
52 y.o. G22P1001 Divorced  Caucasian Fe here for annual exam. Patient has noted more heat intolerance in the last year. Aware she has gained weight and "really wants to be motivated to loose it".  Has been on Lo Loestrin since 2010 for contraception and cycle control. Partner had prostate cancer, so no concerns with contraception now. Has noted spotting at random times on last 3 packs of pills also. Pus done in the past for same reason with Fibroids noted. Sees PCP yearly for aex, labs, Vitamin D deficiency, glucose and cholesterol management. No other health issues today.  Patient's last menstrual period was 06/28/2017 (exact date).          Sexually active: Yes.    The current method of family planning is OCP (estrogen/progesterone).    Exercising: No.  exercise Smoker:  no  Health Maintenance: Pap:  07-03-14 neg HPV HR neg History of Abnormal Pap: no MMG:  10-06-16 category b density birads 1:neg Self Breast exams: yes Colonoscopy:  none BMD:   none TDaP:  2013 Shingles: no Pneumonia: no Hep C and HIV: not done Labs: if needed   reports that she has never smoked. She has never used smokeless tobacco. She reports that she drinks about 3.0 oz of alcohol per week. She reports that she does not use drugs.  Past Medical History:  Diagnosis Date  . Anxiety   . Depression   . Depression with anxiety 01/08/2014  . Hematuria   . Hyperglycemia 03/19/2015  . Hyperlipidemia, mixed 11/29/2015  . Obesity 11/29/2015  . Rosacea   . Vitamin B12 deficiency   . Vitamin D deficiency     Past Surgical History:  Procedure Laterality Date  . CESAREAN SECTION  2000  . DENTAL SURGERY  01/2011   tooth removed,implant  . WISDOM TOOTH EXTRACTION  age 17    Current Outpatient Medications  Medication Sig Dispense Refill  . cholecalciferol (VITAMIN D) 1000 UNITS tablet Take 1,000 Units by mouth daily.    . Cyanocobalamin (VITAMIN B 12 PO) Take by mouth. Reported on 08/12/2015    . FLUoxetine (PROZAC) 20  MG capsule Take 20 mg by mouth daily.    . Norethindrone-Ethinyl Estradiol-Fe Biphas (LO LOESTRIN FE) 1 MG-10 MCG / 10 MCG tablet Take 1 tablet by mouth daily. 84 tablet 4   No current facility-administered medications for this visit.     Family History  Problem Relation Age of Onset  . Thyroid disease Mother   . Depression Father   . Mental illness Father        Bipolar  . Heart disease Maternal Grandmother   . Stroke Paternal Grandmother   . Diabetes Paternal Grandmother   . Stroke Paternal Grandfather   . Depression Brother   . Depression Brother     ROS:  Pertinent items are noted in HPI.  Otherwise, a comprehensive ROS was negative.  Exam:   BP 108/62   Pulse 70   Resp 16   Ht 5' 7.25" (1.708 m)   Wt 220 lb (99.8 kg)   LMP 06/28/2017 (Exact Date)   BMI 34.20 kg/m  Height: 5' 7.25" (170.8 cm) Ht Readings from Last 3 Encounters:  07/22/17 5' 7.25" (1.708 m)  07/21/16 5' 7.25" (1.708 m)  08/12/15 5\' 8"  (1.727 m)    General appearance: alert, cooperative and appears stated age Head: Normocephalic, without obvious abnormality, atraumatic Neck: no adenopathy, supple, symmetrical, trachea midline and thyroid normal to inspection and palpation Lungs: clear to auscultation bilaterally  Breasts: normal appearance, no masses or tenderness, No nipple retraction or dimpling, No nipple discharge or bleeding, No axillary or supraclavicular adenopathy Heart: regular rate and rhythm Abdomen: soft, non-tender; no masses,  no organomegaly Extremities: extremities normal, atraumatic, no cyanosis or edema Skin: Skin color, texture, turgor normal. No rashes or lesions Lymph nodes: Cervical, supraclavicular, and axillary nodes normal. No abnormal inguinal nodes palpated Neurologic: Grossly normal   Pelvic: External genitalia:  no lesions, normal female              Urethra:  normal appearing urethra with no masses, tenderness or lesions              Bartholin's and Skene's: normal                  Vagina: normal appearing vagina with normal color and discharge, no lesions              Cervix: multiparous appearance, no cervical motion tenderness and no lesions              Pap taken: Yes.   Bimanual Exam:  Uterus:  normal size, contour, position, consistency, mobility, non-tender and anteverted, no size change.              Adnexa: normal adnexa and no mass, fullness, tenderness               Rectovaginal: Confirms               Anus:  normal sphincter tone, no lesions  Chaperone present: yes  A:  Well Woman with normal exam  Contraception OCP, not needed now  Perimenopausal symptoms with heat intolerance  Obesity  Glucose,cholesterol,vitamin d deficiency, depression with PCP management  Colonoscopy due  P:   Reviewed health and wellness pertinent to exam  Discussed stopping after completes this pack and coming in for labs to evaluate for menopausal change and heat intolerance. Will prefer she be off OCP for 10 days prior to labs. Patient agreeable and will schedule.  Discussed health risk with obesity and other health issues. Discussed exercise with chair yoga to start and seeking Bariatric clinic dietary weight loss option. Reduce portion sizes and increase water intake daily. Walk everywhere she can to increase activity. Questions addressed.  Continue to follow up with PCP as indicated.  Discussed risks/benefits of colonoscopy, requests referral to Dr. Collene Mares  Pap smear: yes   counseled on breast self exam, mammography screening, feminine hygiene, adequate intake of calcium and vitamin D, diet and exercise  return annually or prn  An After Visit Summary was printed and given to the patient.

## 2017-07-23 ENCOUNTER — Ambulatory Visit: Payer: Managed Care, Other (non HMO) | Admitting: Nurse Practitioner

## 2017-07-26 LAB — CYTOLOGY - PAP
Diagnosis: NEGATIVE
HPV: NOT DETECTED

## 2017-08-02 ENCOUNTER — Other Ambulatory Visit: Payer: Managed Care, Other (non HMO)

## 2017-08-04 ENCOUNTER — Other Ambulatory Visit: Payer: Managed Care, Other (non HMO)

## 2017-08-04 DIAGNOSIS — N951 Menopausal and female climacteric states: Secondary | ICD-10-CM

## 2017-08-04 DIAGNOSIS — R6889 Other general symptoms and signs: Secondary | ICD-10-CM

## 2017-08-05 LAB — THYROID PANEL WITH TSH
Free Thyroxine Index: 1.5 (ref 1.2–4.9)
T3 Uptake Ratio: 21 % — ABNORMAL LOW (ref 24–39)
T4, Total: 7.2 ug/dL (ref 4.5–12.0)
TSH: 2.55 u[IU]/mL (ref 0.450–4.500)

## 2017-08-05 LAB — FOLLICLE STIMULATING HORMONE: FSH: 8.5 m[IU]/mL

## 2017-09-15 ENCOUNTER — Other Ambulatory Visit: Payer: Self-pay | Admitting: Gastroenterology

## 2017-09-15 DIAGNOSIS — R7989 Other specified abnormal findings of blood chemistry: Secondary | ICD-10-CM

## 2017-09-15 DIAGNOSIS — R945 Abnormal results of liver function studies: Principal | ICD-10-CM

## 2017-11-23 ENCOUNTER — Ambulatory Visit (INDEPENDENT_AMBULATORY_CARE_PROVIDER_SITE_OTHER): Payer: Managed Care, Other (non HMO) | Admitting: Internal Medicine

## 2017-11-23 ENCOUNTER — Encounter: Payer: Self-pay | Admitting: Internal Medicine

## 2017-11-23 VITALS — BP 110/78 | HR 86 | Ht 67.25 in | Wt 215.4 lb

## 2017-11-23 DIAGNOSIS — R7303 Prediabetes: Secondary | ICD-10-CM | POA: Diagnosis not present

## 2017-11-23 LAB — POCT GLYCOSYLATED HEMOGLOBIN (HGB A1C): Hemoglobin A1C: 6.1 % — AB (ref 4.0–5.6)

## 2017-11-23 MED ORDER — GLUCOSE BLOOD VI STRP: ORAL_STRIP | 12 refills | Status: DC

## 2017-11-23 MED ORDER — METFORMIN HCL 500 MG PO TABS: 500.0000 mg | ORAL_TABLET | Freq: Two times a day (BID) | ORAL | 3 refills | Status: DC

## 2017-11-23 MED ORDER — ONETOUCH ULTRASOFT LANCETS MISC: 12 refills | Status: DC

## 2017-11-23 NOTE — Addendum Note (Signed)
Addended by: Drucilla Schmidt on: 11/23/2017 02:22 PM   Modules accepted: Orders

## 2017-11-23 NOTE — Progress Notes (Signed)
Patient ID: Cynthia Walters, female   DOB: 04-Dec-1965, 52 y.o.   MRN: [id number]   HPI: Cynthia Walters is a 52 y.o.-year-old female, referred by her PCP, Dr. Kathyrn Lass, for management of prediabetes, hx of GDM in 2000, dx in 2014, non-insulin-dependent, controlled, without long-term complications.  She recently had an HbA1c that was higher than before, at 6.8%.  She was then started of Metformin (500 mg with L and D) and referred to endocrinology.  Since the last HbA1c, she started the [facility] Weight Loss >> meal replacement (Optifast); also more exercise. She will also start working with a Physiological scientist.    Last hemoglobin A1c was: 08/30/2017: HbA1c 6.8% Lab Results  Component Value Date   HGBA1C 6.0 (H) 07/21/2016   HGBA1C 6.1 11/29/2015   HGBA1C 6.2 08/12/2015   HGBA1C 6.1 03/20/2015   HGBA1C 5.9 (H) 04/23/2014   HGBA1C 6.0 (H) 01/08/2014   HGBA1C 5.6 10/16/2013   HGBA1C 6.2 (H) 01/02/2013   Other pertinent labs: 09/21/2017: Glucose 122, insulin 25.4  Pt is not checking sugars as she does not have a glucometer.  She knows how to check her sugars as she was doing so when she had gestational diabetes. - am: n/c - 2h after b'fast: n/c - before lunch: n/c - 2h after lunch: n/c - before dinner: n/c - 2h after dinner: n/c - bedtime: n/c - nighttime: n/c  Glucometer: No  Pt's meals are: - Breakfast: egg + toast >> now Optifast high protein - started in July - Lunch: sub sandwich, fries or meat + vegies >> now Optifast - started in July + egg + yoghurt + deli meat - Dinner: meat + veggies + starch - Snacks: no or peanuts  - no CKD, last BUN/creatinine:  09/21/2017: Glucose 122, BUN/creatinine 14/0.7, GFR >90 Lab Results  Component Value Date   BUN 12 11/29/2015   BUN 15 08/12/2015   CREATININE 0.68 11/29/2015   CREATININE 0.75 08/12/2015   -+ HL; last set of lipids: 08/30/2017: 179/103/58/101 Lab Results  Component Value Date   CHOL 169 11/29/2015   HDL 56.30  11/29/2015   LDLCALC 95 11/29/2015   TRIG 87.0 11/29/2015   CHOLHDL 3 11/29/2015   - last eye exam was "years ago". No DR.  - no numbness and tingling in her feet.  Pt has FH of DM in [place].  ROS: Constitutional: no weight gain/loss, + fatigue, + heat intolerance/hot flashes Eyes: no blurry vision, no xerophthalmia ENT: no sore throat, no nodules palpated in throat, no dysphagia/odynophagia, no hoarseness Cardiovascular: no CP/SOB/palpitations/leg swelling Respiratory: no cough/SOB Gastrointestinal: no N/V/D/C Musculoskeletal: no muscle/joint aches Skin: no rashes Neurological: no tremors/numbness/tingling/dizziness Psychiatric: + Both: depression/anxiety  Past Medical History:  Diagnosis Date  . Anxiety   . Depression   . Depression with anxiety 01/08/2014  . Hematuria   . Hyperglycemia 03/19/2015  . Hyperlipidemia, mixed 11/29/2015  . Obesity 11/29/2015  . Rosacea   . Vitamin B12 deficiency   . Vitamin D deficiency    Past Surgical History:  Procedure Laterality Date  . CESAREAN SECTION  2000  . DENTAL SURGERY  01/2011   tooth removed,implant  . WISDOM TOOTH EXTRACTION  age 3   Social History   Socioeconomic History  . Marital status: Divorced    Spouse name: Not on file  . Number of children: 1  . Years of education: 29  . Highest education level: Not on file  Occupational History  . Occupation: Optometrist  Employer: [employer]  Social Needs  . Financial resource strain: Not on file  . Food insecurity:    Worry: Not on file    Inability: Not on file  . Transportation needs:    Medical: Not on file    Non-medical: Not on file  Tobacco Use  . Smoking status: Never Smoker  . Smokeless tobacco: Never Used  Substance and Sexual Activity  . Alcohol use: Yes    Alcohol/week: 5.0 standard drinks    Types: 5 Glasses of wine per week    Comment: occassional wine with dinner  . Drug use: No  . Sexual activity: Yes    Partners: Male    Birth  control/protection: Pill  Lifestyle  . Physical activity:    Days per week: Not on file    Minutes per session: Not on file  . Stress: Not on file  Relationships  . Social connections:    Talks on phone: Not on file    Gets together: Not on file    Attends religious service: Not on file    Active member of club or organization: Not on file    Attends meetings of clubs or organizations: Not on file    Relationship status: Not on file  . Intimate partner violence:    Fear of current or ex partner: Not on file    Emotionally abused: Not on file    Physically abused: Not on file    Forced sexual activity: Not on file  Other Topics Concern  . Not on file  Social History Narrative   Marital status:  Divorced, Dating x 5 years; no abuse.      Children: one son (77 yo)      lives with:   46 year old son and her boyfriend.      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      Tobacco:  No tobacco.      Alcohol:  Beer/wine occasionally      Exercise:  Not currently exercising   Current Outpatient Medications on File Prior to Visit  Medication Sig Dispense Refill  . cholecalciferol (VITAMIN D) 1000 UNITS tablet Take 1,000 Units by mouth daily.    . Cyanocobalamin (VITAMIN B 12 PO) Take by mouth. Reported on 08/12/2015    . FLUoxetine (PROZAC) 20 MG capsule Take 20 mg by mouth daily.    . metFORMIN (GLUCOPHAGE) 500 MG tablet Take by mouth.    . Norethindrone-Ethinyl Estradiol-Fe Biphas (LO LOESTRIN FE) 1 MG-10 MCG / 10 MCG tablet Take 1 tablet by mouth daily. 84 tablet 4   No current facility-administered medications on file prior to visit.    No Known Allergies Family History  Problem Relation Age of Onset  . Thyroid disease Mother   . Depression Father   . Mental illness Father        Bipolar  . Heart disease Maternal Grandmother   . Stroke Paternal Grandmother   . Diabetes Paternal Grandmother   . Stroke Paternal Grandfather   .  Depression Brother   . Depression Brother    PE: BP 110/78   Pulse 86   Ht 5' 7.25" (1.708 m)   Wt 215 lb 6.4 oz (97.7 kg)   SpO2 97%   BMI 33.49 kg/m  Wt Readings from Last 3 Encounters:  11/23/17 215 lb 6.4 oz (97.7 kg)  07/22/17 220 lb (99.8 kg)  07/21/16 215  lb (97.5 kg)   Constitutional: overweight, in NAD Eyes: PERRLA, EOMI, no exophthalmos ENT: moist mucous membranes, no thyromegaly, no cervical lymphadenopathy Cardiovascular: RRR, No MRG Respiratory: CTA B Gastrointestinal: abdomen soft, NT, ND, BS+ Musculoskeletal: no deformities, strength intact in all 4 Skin: moist, warm, no rashes Neurological: no tremor with outstretched hands, DTR normal in all 4  ASSESSMENT: 1. Prediabetes  PLAN:  1. Patient with a history of prediabetes at least since 2014, with a previous history of gestational diabetes in 2000, and with a recent HbA1c in the diabetic range, is 6.8%.  We discussed that the diagnosis of diabetes can be placed only if 2 measurements indicated, and in her case, she only has 1 HbA1c in the diabetic range, which is higher than 6.4%.  Indeed, at this visit, repeat HbA1c is better, at 6.1%.  The reason for the improvement is a change in diet (she started Optifast by the [facility] weight loss center), starting exercise, and also starting metformin at 500 mg twice a day, which she tolerates well.   - We discussed about mechanism of action for metformin and its benefits.  Since she tolerates this well, we can continue the current low dose of 500 mg twice a day.  I also strongly encouraged her to continue with her diet and exercise.  No other changes are needed for now. - At this visit, we discussed about the importance of checking her sugars every day or every other day and we gave her a One Touch very low IQ meter.  I sent prescriptions for lancets and strips to her pharmacy.  We discussed about targets for treatment - I suggested to:  Patient Instructions  Please buy a  ReliOn meter from [place].(Only if insurance does not cover the One Touch very IQ meter that I gave her)  Please continue Metformin 500 mg 2x a day with meals.  Start checking sugars every day or every other day.  Please let me know if the sugars are consistently <80 or >180.  Please return in 3 months with your sugar log.   - given sugar log and advised how to fill it and to bring it at next appt  - given foot care handout and explained the principles  - given instructions for hypoglycemia management "15-15 rule"  - advised for yearly eye exams.  She has one scheduled. - Return to clinic in 3 mo with sugar log   Philemon Kingdom, MD PhD [facility] Endocrinology

## 2017-11-23 NOTE — Patient Instructions (Addendum)
Please buy a ReliOn meter from [facility].  Please continue Metformin 500 mg 2x a day with meals.  Start checking sugars every day or every other day.  Please let me know if the sugars are consistently <80 or >180.  Please return in 3 months with your sugar log.   PATIENT INSTRUCTIONS FOR TYPE 2 DIABETES:  DIET AND EXERCISE Diet and exercise is an important part of diabetic treatment.  We recommended aerobic exercise in the form of brisk walking (working between 40-60% of maximal aerobic capacity, similar to brisk walking) for 150 minutes per week (such as 30 minutes five days per week) along with 3 times per week performing 'resistance' training (using various gauge rubber tubes with handles) 5-10 exercises involving the major muscle groups (upper body, lower body and core) performing 10-15 repetitions (or near fatigue) each exercise. Start at half the above goal but build slowly to reach the above goals. If limited by weight, joint pain, or disability, we recommend daily walking in a swimming pool with water up to waist to reduce pressure from joints while allow for adequate exercise.    BLOOD GLUCOSES Monitoring your blood glucoses is important for continued management of your diabetes. Please check your blood glucoses 2-4 times a day: fasting, before meals and at bedtime (you can rotate these measurements - e.g. one day check before the 3 meals, the next day check before 2 of the meals and before bedtime, etc.).   HYPOGLYCEMIA (low blood sugar) Hypoglycemia is usually a reaction to not eating, exercising, or taking too much insulin/ other diabetes drugs.  Symptoms include tremors, sweating, hunger, confusion, headache, etc. Treat IMMEDIATELY with 15 grams of Carbs: . 4 glucose tablets .  cup regular juice/soda . 2 tablespoons raisins . 4 teaspoons sugar . 1 tablespoon honey Recheck blood glucose in 15 mins and repeat above if still symptomatic/blood glucose <100.  RECOMMENDATIONS TO  REDUCE YOUR RISK OF DIABETIC COMPLICATIONS: * Take your prescribed MEDICATION(S) * Follow a DIABETIC diet: Complex carbs, fiber rich foods, (monounsaturated and polyunsaturated) fats * AVOID saturated/trans fats, high fat foods, >2,300 mg salt per day. * EXERCISE at least 5 times a week for 30 minutes or preferably daily.  * DO NOT SMOKE OR DRINK more than 1 drink a day. * Check your FEET every day. Do not wear tightfitting shoes. Contact us if you develop an ulcer * See your EYE doctor once a year or more if needed * Get a FLU shot once a year * Get a PNEUMONIA vaccine once before and once after age 73 years  GOALS:  * Your Hemoglobin A1c of <7%  * fasting sugars need to be <130 * after meals sugars need to be <180 (2h after you start eating) * Your Systolic BP should be 756 or lower  * Your Diastolic BP should be 80 or lower  * Your HDL (Good Cholesterol) should be 40 or higher  * Your LDL (Bad Cholesterol) should be 100 or lower. * Your Triglycerides should be 150 or lower  * Your Urine microalbumin (kidney function) should be <30 * Your Body Mass Index should be 25 or lower    Please consider the following ways to cut down carbs and fat and increase fiber and micronutrients in your diet: - substitute whole grain for white bread or pasta - substitute brown rice for white rice - substitute 90-calorie flat bread pieces for slices of bread when possible - substitute sweet potatoes or yams for white potatoes -  substitute humus for margarine - substitute tofu for cheese when possible - substitute almond or rice milk for regular milk (would not drink soy milk daily due to concern for soy estrogen influence on breast cancer risk) - substitute dark chocolate for other sweets when possible - substitute water - can add lemon or orange slices for taste - for diet sodas (artificial sweeteners will trick your body that you can eat sweets without getting calories and will lead you to overeating  and weight gain in the long run) - do not skip breakfast or other meals (this will slow down the metabolism and will result in more weight gain over time)  - can try smoothies made from fruit and almond/rice milk in am instead of regular breakfast - can also try old-fashioned (not instant) oatmeal made with almond/rice milk in am - order the dressing on the side when eating salad at a restaurant (pour less than half of the dressing on the salad) - eat as little meat as possible - can try juicing, but should not forget that juicing will get rid of the fiber, so would alternate with eating raw veg./fruits or drinking smoothies - use as little oil as possible, even when using olive oil - can dress a salad with a mix of balsamic vinegar and lemon juice, for e.g. - use agave nectar, stevia sugar, or regular sugar rather than artificial sweateners - steam or broil/roast veggies  - snack on veggies/fruit/nuts (unsalted, preferably) when possible, rather than processed foods - reduce or eliminate aspartame in diet (it is in diet sodas, chewing gum, etc) Read the labels!  Try to read Dr. Janene Harvey book: "Program for Reversing Diabetes" for other ideas for healthy eating.

## 2018-03-04 ENCOUNTER — Ambulatory Visit: Payer: Managed Care, Other (non HMO) | Admitting: Internal Medicine

## 2018-04-18 ENCOUNTER — Encounter: Payer: Self-pay | Admitting: Obstetrics & Gynecology

## 2018-06-09 ENCOUNTER — Other Ambulatory Visit: Payer: Self-pay

## 2018-06-09 ENCOUNTER — Ambulatory Visit (INDEPENDENT_AMBULATORY_CARE_PROVIDER_SITE_OTHER): Payer: Managed Care, Other (non HMO) | Admitting: Internal Medicine

## 2018-06-09 ENCOUNTER — Encounter: Payer: Self-pay | Admitting: Internal Medicine

## 2018-06-09 VITALS — BP 122/70 | HR 78 | Ht 67.25 in | Wt 213.0 lb

## 2018-06-09 DIAGNOSIS — R7303 Prediabetes: Secondary | ICD-10-CM | POA: Diagnosis not present

## 2018-06-09 LAB — POCT GLYCOSYLATED HEMOGLOBIN (HGB A1C): Hemoglobin A1C: 6 % — AB (ref 4.0–5.6)

## 2018-06-09 MED ORDER — METFORMIN HCL 1000 MG PO TABS: 1000.0000 mg | ORAL_TABLET | Freq: Every day | ORAL | 3 refills | Status: DC

## 2018-06-09 MED ORDER — ONETOUCH DELICA PLUS LANCET33G MISC: 1.0000 | Freq: Every day | 12 refills | Status: DC

## 2018-06-09 NOTE — Patient Instructions (Addendum)
Please move metformin 1000 mg with dinner.  Check sugars every day or every other day  Please let me know if the sugars are consistently <80 or >180.  Please return in 6 months with your sugar log.

## 2018-06-09 NOTE — Progress Notes (Addendum)
Patient ID: Cynthia Walters, female   DOB: [birth year], 53 y.o.   MRN: [id number]   HPI: Cynthia Walters is a 53 y.o.-year-old female, returning for follow-up for prediabetes, hx of GDM in 2000, dx in 2014, non-insulin-dependent, controlled, without long-term complications.  Last visit 6.5 months ago.  Reviewed and addended history: Before I last saw her, she had a higher HbA1c, at 6.8%.  She was then started of Metformin (500 mg with L and D) and referred to endocrinology.  Since the above HbA1c, she started the [facility] Weight Loss >> meal replacement (Optifast); also more exercise. She will also start working with a Physiological scientist.    At last visit, HbA1c was significantly better, at 6.1%.  She is now off the [employer].  Last hemoglobin A1c was: Lab Results  Component Value Date   HGBA1C 6.1 (A) 11/23/2017   HGBA1C 6.0 (H) 07/21/2016   HGBA1C 6.1 11/29/2015   HGBA1C 6.2 08/12/2015   HGBA1C 6.1 03/20/2015   HGBA1C 5.9 (H) 04/23/2014   HGBA1C 6.0 (H) 01/08/2014   HGBA1C 5.6 10/16/2013   HGBA1C 6.2 (H) 01/02/2013  08/30/2017: HbA1c 6.8% Other pertinent labs: 09/21/2017: Glucose 122, insulin 25.4  She only started to check sugars in the last few days: - am: 109, 136, 147 - 2h after b'fast: n/c - before lunch: n/c - 2h after lunch: n/c - before dinner: 102 - 2h after dinner: 126 - bedtime: n/c - nighttime: n/c  Glucometer: No  -No CKD, last BUN/creatinine:  09/21/2017: Glucose 122, BUN/creatinine 14/0.7, GFR >90 Lab Results  Component Value Date   BUN 12 11/29/2015   BUN 15 08/12/2015   CREATININE 0.68 11/29/2015   CREATININE 0.75 08/12/2015   -+ HL; last set of lipids: 08/30/2017: 179/103/58/101 Lab Results  Component Value Date   CHOL 169 11/29/2015   HDL 56.30 11/29/2015   LDLCALC 95 11/29/2015   TRIG 87.0 11/29/2015   CHOLHDL 3 11/29/2015  She is not on a statin.  - last eye exam was "Years ago": No DR - no numbness and tingling in her feet.  Pt has  FH of DM in [place].  ROS: Constitutional: no weight gain/no weight loss, no fatigue, no subjective hyperthermia, no subjective hypothermia Eyes: no blurry vision, no xerophthalmia ENT: no sore throat, no nodules palpated in neck, no dysphagia, no odynophagia, no hoarseness Cardiovascular: no CP/no SOB/no palpitations/no leg swelling Respiratory: no cough/no SOB/no wheezing Gastrointestinal: no N/no V/no D/no C/no acid reflux Musculoskeletal: no muscle aches/no joint aches Skin: no rashes, no hair loss Neurological: no tremors/no numbness/no tingling/no dizziness  I reviewed pt's medications, allergies, PMH, social hx, family hx, and changes were documented in the history of present illness. Otherwise, unchanged from my initial visit note.  Past Medical History:  Diagnosis Date  . Anxiety   . Depression   . Depression with anxiety 01/08/2014  . Hematuria   . Hyperglycemia 03/19/2015  . Hyperlipidemia, mixed 11/29/2015  . Obesity 11/29/2015  . Rosacea   . Vitamin B12 deficiency   . Vitamin D deficiency    Past Surgical History:  Procedure Laterality Date  . CESAREAN SECTION  2000  . DENTAL SURGERY  01/2011   tooth removed,implant  . WISDOM TOOTH EXTRACTION  age 83   Social History   Socioeconomic History  . Marital status: Divorced    Spouse name: Not on file  . Number of children: 1  . Years of education: 43  . Highest education level: Not on  file  Occupational History  . Occupation: Aeronautical engineer: [employer]  Social Needs  . Financial resource strain: Not on file  . Food insecurity:    Worry: Not on file    Inability: Not on file  . Transportation needs:    Medical: Not on file    Non-medical: Not on file  Tobacco Use  . Smoking status: Never Smoker  . Smokeless tobacco: Never Used  Substance and Sexual Activity  . Alcohol use: Yes    Alcohol/week: 5.0 standard drinks    Types: 5 Glasses of wine per week    Comment: occassional wine with  dinner  . Drug use: No  . Sexual activity: Yes    Partners: Male    Birth control/protection: Pill  Lifestyle  . Physical activity:    Days per week: Not on file    Minutes per session: Not on file  . Stress: Not on file  Relationships  . Social connections:    Talks on phone: Not on file    Gets together: Not on file    Attends religious service: Not on file    Active member of club or organization: Not on file    Attends meetings of clubs or organizations: Not on file    Relationship status: Not on file  . Intimate partner violence:    Fear of current or ex partner: Not on file    Emotionally abused: Not on file    Physically abused: Not on file    Forced sexual activity: Not on file  Other Topics Concern  . Not on file  Social History Narrative   Marital status:  Divorced, Dating x 5 years; no abuse.      Children: one son (52 yo)      lives with:   55 year old son and her boyfriend.      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      Tobacco:  No tobacco.      Alcohol:  Beer/wine occasionally      Exercise:  Not currently exercising   Current Outpatient Medications on File Prior to Visit  Medication Sig Dispense Refill  . cholecalciferol (VITAMIN D) 1000 UNITS tablet Take 1,000 Units by mouth daily.    . Cyanocobalamin (VITAMIN B 12 PO) Take by mouth. Reported on 08/12/2015    . FLUoxetine (PROZAC) 20 MG capsule Take 20 mg by mouth daily.    Marland Kitchen glucose blood test strip Use as instructed once a day - for OneTouch Verio IQ meter 100 each 12  . Lancets (ONETOUCH ULTRASOFT) lancets Use as instructed once a day - for OneTouch Verio IQ meter 100 each 12  . metFORMIN (GLUCOPHAGE) 500 MG tablet Take 1 tablet (500 mg total) by mouth 2 (two) times daily with a meal. 180 tablet 3   No current facility-administered medications on file prior to visit.    No Known Allergies Family History  Problem Relation Age of Onset  . Thyroid  disease Mother   . Depression Father   . Mental illness Father        Bipolar  . Heart disease Maternal Grandmother   . Stroke Paternal Grandmother   . Diabetes Paternal Grandmother   . Stroke Paternal Grandfather   . Depression Brother   . Depression Brother    PE: BP 122/70   Pulse 78   Ht 5' 7.25" (1.708 m)  Comment: measured  Wt 213 lb (96.6 kg)   SpO2 97%   BMI 33.11 kg/m  Wt Readings from Last 3 Encounters:  06/09/18 213 lb (96.6 kg)  11/23/17 215 lb 6.4 oz (97.7 kg)  07/22/17 220 lb (99.8 kg)   Constitutional: overweight, in NAD Eyes: PERRLA, EOMI, no exophthalmos ENT: moist mucous membranes, no thyromegaly, no cervical lymphadenopathy Cardiovascular: RRR, No MRG Respiratory: CTA B Gastrointestinal: abdomen soft, NT, ND, BS+ Musculoskeletal: no deformities, strength intact in all 4 Skin: moist, warm, no rashes Neurological: no tremor with outstretched hands, DTR normal in all 4  ASSESSMENT: 1. Prediabetes  2. Obesity  3. HL  PLAN:  1. Patient with history of prediabetes at least since 2014, and a previous history of gestational diabetes in 2000.  Before last visit, HbA1c was 6.8%, however, she started to work with [facility] weight loss program and she also started to exercise and started metformin 500 mg twice a day.  She tolerates this well.  At last visit, HbA1c was better, at 6.1%.  We continued metformin at that time and I encouraged her to continue exercise and diet.  I also advised her to start checking sugars every day or at least every other day. -At this visit, she was telling me that she did not check sugars until few days ago.  She had 1 of 2 higher blood sugars, but the rest were at goal.  We discussed the importance of checking sugars consistently.  She will start doing so. -As of now, since sugars in the morning are slightly higher, I advised her to move the whole dose of metformin with dinner. - I suggested to:  Patient Instructions  Please move  metformin 1000 mg with dinner.  Check sugars every day or every other day  Please let me know if the sugars are consistently <80 or >180.  Please return in 6 months with your sugar log.   - today, HbA1c is 6.0% (better) - advised for yearly eye exams >> she is UTD - Return to clinic in 6 mo with sugar log   2. Obesity - lost 2 pounds since last visit - stopped Optifast - joined [employer], worked with the Physiological scientist - now joined a walk-run group - She is also working on improving her diet and also the times when she eats - Discussed about the possible referral to nutrition or to the weight management clinic at [facility].  She will let me know if she decides for this.  3. HL - Reviewed latest lipid panel from last year: LDL above goal, the rest of the fractions are at goal - She is not on a statin   Philemon Kingdom, MD PhD [facility] Endocrinology

## 2018-06-09 NOTE — Addendum Note (Signed)
Addended by: Cardell Peach I on: 06/09/2018 04:02 PM   Modules accepted: Orders

## 2018-08-04 ENCOUNTER — Ambulatory Visit: Payer: Managed Care, Other (non HMO) | Admitting: Certified Nurse Midwife

## 2018-10-18 ENCOUNTER — Ambulatory Visit: Payer: Managed Care, Other (non HMO) | Admitting: Certified Nurse Midwife

## 2018-11-14 ENCOUNTER — Telehealth: Payer: Self-pay | Admitting: Certified Nurse Midwife

## 2018-11-14 NOTE — Telephone Encounter (Signed)
Left message on voicemail to call and reschedule cancelled appointment. °

## 2018-12-02 ENCOUNTER — Ambulatory Visit: Payer: Managed Care, Other (non HMO) | Admitting: Certified Nurse Midwife

## 2018-12-09 ENCOUNTER — Ambulatory Visit: Payer: Managed Care, Other (non HMO) | Admitting: Internal Medicine

## 2019-01-20 ENCOUNTER — Ambulatory Visit: Payer: Managed Care, Other (non HMO) | Admitting: Internal Medicine

## 2019-06-19 ENCOUNTER — Encounter: Payer: Self-pay | Admitting: Certified Nurse Midwife

## 2019-07-03 ENCOUNTER — Other Ambulatory Visit: Payer: Self-pay | Admitting: Internal Medicine

## 2019-07-04 ENCOUNTER — Other Ambulatory Visit: Payer: Self-pay | Admitting: Internal Medicine

## 2019-09-01 ENCOUNTER — Other Ambulatory Visit: Payer: Self-pay

## 2019-09-01 ENCOUNTER — Encounter: Payer: Self-pay | Admitting: Family Medicine

## 2019-09-01 ENCOUNTER — Ambulatory Visit (INDEPENDENT_AMBULATORY_CARE_PROVIDER_SITE_OTHER): Payer: BC Managed Care – PPO | Admitting: Family Medicine

## 2019-09-01 VITALS — BP 112/78 | HR 80 | Temp 97.1°F | Ht 67.25 in | Wt 218.4 lb

## 2019-09-01 DIAGNOSIS — E782 Mixed hyperlipidemia: Secondary | ICD-10-CM

## 2019-09-01 DIAGNOSIS — M722 Plantar fascial fibromatosis: Secondary | ICD-10-CM | POA: Diagnosis not present

## 2019-09-01 DIAGNOSIS — E559 Vitamin D deficiency, unspecified: Secondary | ICD-10-CM | POA: Diagnosis not present

## 2019-09-01 DIAGNOSIS — R739 Hyperglycemia, unspecified: Secondary | ICD-10-CM | POA: Diagnosis not present

## 2019-09-01 DIAGNOSIS — E538 Deficiency of other specified B group vitamins: Secondary | ICD-10-CM | POA: Diagnosis not present

## 2019-09-01 DIAGNOSIS — R5383 Other fatigue: Secondary | ICD-10-CM | POA: Diagnosis not present

## 2019-09-01 LAB — COMPREHENSIVE METABOLIC PANEL
ALT: 70 U/L — ABNORMAL HIGH (ref 0–35)
AST: 42 U/L — ABNORMAL HIGH (ref 0–37)
Albumin: 4.5 g/dL (ref 3.5–5.2)
Alkaline Phosphatase: 87 U/L (ref 39–117)
BUN: 14 mg/dL (ref 6–23)
CO2: 27 mEq/L (ref 19–32)
Calcium: 9.5 mg/dL (ref 8.4–10.5)
Chloride: 104 mEq/L (ref 96–112)
Creatinine, Ser: 0.69 mg/dL (ref 0.40–1.20)
GFR: 89.3 mL/min (ref >60.00)
Glucose, Bld: 123 mg/dL — ABNORMAL HIGH (ref 70–99)
Potassium: 4.6 mEq/L (ref 3.5–5.1)
Sodium: 139 mEq/L (ref 135–145)
Total Bilirubin: 0.5 mg/dL (ref 0.2–1.2)
Total Protein: 7.1 g/dL (ref 6.0–8.3)

## 2019-09-01 LAB — TSH: TSH: 3.1 u[IU]/mL (ref 0.35–4.50)

## 2019-09-01 LAB — CBC WITH DIFFERENTIAL/PLATELET
Basophils Absolute: 0 10*3/uL (ref 0.0–0.1)
Basophils Relative: 0.7 % (ref 0.0–3.0)
Eosinophils Absolute: 0.3 10*3/uL (ref 0.0–0.7)
Eosinophils Relative: 4.6 % (ref 0.0–5.0)
HCT: 41.2 % (ref 36.0–46.0)
Hemoglobin: 14.1 g/dL (ref 12.0–15.0)
Lymphocytes Relative: 27.1 % (ref 12.0–46.0)
Lymphs Abs: 1.6 10*3/uL (ref 0.7–4.0)
MCHC: 34.1 g/dL (ref 30.0–36.0)
MCV: 88.2 fl (ref 78.0–100.0)
Monocytes Absolute: 0.5 10*3/uL (ref 0.1–1.0)
Monocytes Relative: 8.6 % (ref 3.0–12.0)
Neutro Abs: 3.5 10*3/uL (ref 1.4–7.7)
Neutrophils Relative %: 59 % (ref 43.0–77.0)
Platelets: 390 10*3/uL (ref 150.0–400.0)
RBC: 4.68 Mil/uL (ref 3.87–5.11)
RDW: 13.5 % (ref 11.5–15.5)
WBC: 5.9 10*3/uL (ref 4.0–10.5)

## 2019-09-01 LAB — LIPID PANEL
Cholesterol: 201 mg/dL — ABNORMAL HIGH (ref 0–200)
HDL: 64.9 mg/dL (ref >39.00)
LDL Cholesterol: 110 mg/dL — ABNORMAL HIGH (ref 0–99)
NonHDL: 136.29
Total CHOL/HDL Ratio: 3
Triglycerides: 131 mg/dL (ref 0.0–149.0)
VLDL: 26.2 mg/dL (ref 0.0–40.0)

## 2019-09-01 LAB — HEMOGLOBIN A1C: Hgb A1c MFr Bld: 6.6 % — ABNORMAL HIGH (ref 4.6–6.5)

## 2019-09-01 LAB — VITAMIN D 25 HYDROXY (VIT D DEFICIENCY, FRACTURES): VITD: 54.29 ng/mL (ref 30.00–100.00)

## 2019-09-01 LAB — VITAMIN B12: Vitamin B-12: 410 pg/mL (ref 211–911)

## 2019-09-01 NOTE — Progress Notes (Signed)
Cynthia Walters DOB: 01/23/1966 Encounter date: 09/01/2019  This is a 54 y.o. female who presents to establish care. Chief Complaint  Patient presents with  . Establish Care    History of present illness:  No specific concerns today, just general health, weight, sugar.   Prediabetes: has seen Dr. Cruzita Lederer in the past (last visit was 05/2018). Hasn't been as good with checking sugars. Was doing pretty well with optifast. Maintained weight during COVID. Eating out more now that she is back at work. No specific restrictions now with foods. Still taking the metformin - 1 of the 1000mg  daily.  HL: has been diet controlled.   History of anxiety/depression. Dad and brothers all have depression. Dad diagnosed as bipolar, but is 52 and doesn't take medication for it. Some depression after son born. Feels that hers is more of lack of motivation. Feels like anxiety is bigger factor than it was in the past, but right now feels like she is ok. Energy and motivation have been issues. Fluoxetine is from previous primary care. Had been on wellbutrin in past. Stopped as depression was better controlled.   Has not been exercising regularly. Not making time for it as often as she should.   Ongoing issues with plantar fasciitis since feb last year. Started in left foot and now seems like it is in right foot. Hasn't seen anyone for it. Has taken naproxen when it bothers her. Has been to [employer] and got good supportive shoes; and it has improved.     Past Medical History:  Diagnosis Date  . Anxiety   . Depression   . Depression with anxiety 01/08/2014  . Hematuria   . Hyperglycemia 03/19/2015  . Hyperlipidemia, mixed 11/29/2015  . Obesity 11/29/2015  . Rosacea   . Vitamin B12 deficiency   . Vitamin D deficiency    Past Surgical History:  Procedure Laterality Date  . CESAREAN SECTION  2000  . DENTAL SURGERY  01/2011   tooth removed,implant  . WISDOM TOOTH EXTRACTION  age 33   No Known  Allergies Current Meds  Medication Sig  . cholecalciferol (VITAMIN D) 1000 UNITS tablet Take 1,000 Units by mouth daily.  Marland Kitchen FLUoxetine (PROZAC) 20 MG capsule Take 20 mg by mouth daily.  Marland Kitchen glucose blood test strip Use as instructed once a day - for [employer] IQ meter  . Lancets (ONETOUCH DELICA PLUS [id number]) MISC 1 each by Does not apply route daily. Use to check blood sugar once a day.  . metFORMIN (GLUCOPHAGE) 1000 MG tablet TAKE ONE TABLET BY MOUTH ONE TIME DAILY  with supper   Social History   Tobacco Use  . Smoking status: Never Smoker  . Smokeless tobacco: Never Used  Substance Use Topics  . Alcohol use: Yes    Alcohol/week: 5.0 standard drinks    Types: 5 Glasses of wine per week    Comment: occassional wine with dinner   Family History  Problem Relation Age of Onset  . Thyroid disease Mother   . Bipolar disorder Father   . Obesity Father   . Hearing loss Father        does run in family  . Hypertension Maternal Grandmother   . Heart attack Maternal Grandmother 83  . Stroke Paternal Grandmother 48  . Diabetes Paternal Grandmother   . Stroke Paternal Grandfather 83  . Depression Brother   . Depression Brother   . ALS Maternal Grandfather      Review of Systems  Constitutional:  Positive for fatigue. Negative for chills and fever.  Respiratory: Negative for cough, chest tightness, shortness of breath and wheezing.   Cardiovascular: Negative for chest pain, palpitations and leg swelling.  Psychiatric/Behavioral: The patient is nervous/anxious.        Depression    Objective:  BP 112/78 (BP Location: Left Arm, Patient Position: Sitting, Cuff Size: Large)   Pulse 80   Temp (!) 97.1 F (36.2 C) (Temporal)   Ht 5' 7.25" (1.708 m)   Wt 218 lb 6.4 oz (99.1 kg)   LMP 10/29/2018 (Approximate)   BMI 33.95 kg/m   Weight: 218 lb 6.4 oz (99.1 kg)   BP Readings from Last 3 Encounters:  09/01/19 112/78  06/09/18 122/70  11/23/17 110/78   Wt Readings from Last  3 Encounters:  09/01/19 218 lb 6.4 oz (99.1 kg)  06/09/18 213 lb (96.6 kg)  11/23/17 215 lb 6.4 oz (97.7 kg)    Physical Exam Constitutional:      General: She is not in acute distress.    Appearance: She is well-developed.  Cardiovascular:     Rate and Rhythm: Normal rate and regular rhythm.     Heart sounds: Normal heart sounds. No murmur. No friction rub.  Pulmonary:     Effort: Pulmonary effort is normal. No respiratory distress.     Breath sounds: Normal breath sounds. No wheezing or rales.  Abdominal:     General: Abdomen is flat. Bowel sounds are normal.  Musculoskeletal:     Right lower leg: No edema.     Left lower leg: No edema.  Neurological:     Mental Status: She is alert and oriented to person, place, and time.  Psychiatric:        Behavior: Behavior normal.     Assessment/Plan:  1. Fatigue, unspecified type Will further discuss once labwork returns. - CBC with Differential/Platelet; Future - TSH; Future  2. Plantar fasciitis, bilateral Stretches, icing after activity.   3. Hyperglycemia - Hemoglobin A1c; Future - Comprehensive metabolic panel; Future  4. Vitamin D deficiency - VITAMIN D 25 Hydroxy (Vit-D Deficiency, Fractures); Future  5. Hyperlipidemia, mixed - Lipid panel; Future  6. B12 deficiency - Vitamin B12; Future  Return for pending bloodwork.  [name], MD

## 2019-09-01 NOTE — Addendum Note (Signed)
Addended by: Elmer Picker on: 09/01/2019 10:12 AM   Modules accepted: Orders

## 2019-09-01 NOTE — Patient Instructions (Signed)
Plantar Fasciitis Rehab Ask your health care provider which exercises are safe for you. Do exercises exactly as told by your health care provider and adjust them as directed. It is normal to feel mild stretching, pulling, tightness, or discomfort as you do these exercises. Stop right away if you feel sudden pain or your pain gets worse. Do not begin these exercises until told by your health care provider. Stretching and range-of-motion exercises These exercises warm up your muscles and joints and improve the movement and flexibility of your foot. These exercises also help to relieve pain. Plantar fascia stretch  1. Sit with your left / right leg crossed over your opposite knee. 2. Hold your heel with one hand with that thumb near your arch. With your other hand, hold your toes and gently pull them back toward the top of your foot. You should feel a stretch on the bottom of your toes or your foot (plantar fascia) or both. 3. Hold this stretch for__________ seconds. 4. Slowly release your toes and return to the starting position. Repeat __________ times. Complete this exercise __________ times a day. Gastrocnemius stretch, standing This exercise is also called a calf (gastroc) stretch. It stretches the muscles in the back of the upper calf. 1. Stand with your hands against a wall. 2. Extend your left / right leg behind you, and bend your front knee slightly. 3. Keeping your heels on the floor and your back knee straight, shift your weight toward the wall. Do not arch your back. You should feel a gentle stretch in your upper left / right calf. 4. Hold this position for __________ seconds. Repeat __________ times. Complete this exercise __________ times a day. Soleus stretch, standing This exercise is also called a calf (soleus) stretch. It stretches the muscles in the back of the lower calf. 1. Stand with your hands against a wall. 2. Extend your left / right leg behind you, and bend your front  knee slightly. 3. Keeping your heels on the floor, bend your back knee and shift your weight slightly over your back leg. You should feel a gentle stretch deep in your lower calf. 4. Hold this position for __________ seconds. Repeat __________ times. Complete this exercise __________ times a day. Gastroc and soleus stretch, standing step This exercise stretches the muscles in the back of the lower leg. These muscles are in the upper calf (gastrocnemius) and the lower calf (soleus). 1. Stand with the ball of your left / right foot on a step. The ball of your foot is on the walking surface, right under your toes. 2. Keep your other foot firmly on the same step. 3. Hold on to the wall or a railing for balance. 4. Slowly lift your other foot, allowing your body weight to press your left / right heel down over the edge of the step. You should feel a stretch in your left / right calf. 5. Hold this position for __________ seconds. 6. Return both feet to the step. 7. Repeat this exercise with a slight bend in your left / right knee. Repeat __________ times with your left / right knee straight and __________ times with your left / right knee bent. Complete this exercise __________ times a day. Balance exercise This exercise builds your balance and strength control of your arch to help take pressure off your plantar fascia. Single leg stand If this exercise is too easy, you can try it with your eyes closed or while standing on a pillow. 1.   Without shoes, stand near a railing or in a doorway. You may hold on to the railing or door frame as needed. 2. Stand on your left / right foot. Keep your big toe down on the floor and try to keep your arch lifted. Do not let your foot roll inward. 3. Hold this position for __________ seconds. Repeat __________ times. Complete this exercise __________ times a day. This information is not intended to replace advice given to you by your health care provider. Make sure  you discuss any questions you have with your health care provider. Document Revised: 07/07/2018 Document Reviewed: 01/12/2018 Elsevier Patient Education  2020 Elsevier Inc.  

## 2019-09-08 ENCOUNTER — Other Ambulatory Visit: Payer: Self-pay | Admitting: Family Medicine

## 2019-09-08 DIAGNOSIS — E119 Type 2 diabetes mellitus without complications: Secondary | ICD-10-CM

## 2019-09-08 DIAGNOSIS — R748 Abnormal levels of other serum enzymes: Secondary | ICD-10-CM

## 2019-09-08 NOTE — Progress Notes (Signed)
Labs ordered.

## 2019-09-12 ENCOUNTER — Encounter: Payer: Self-pay | Admitting: Internal Medicine

## 2019-09-12 ENCOUNTER — Encounter: Payer: Self-pay | Admitting: Family Medicine

## 2019-09-12 ENCOUNTER — Other Ambulatory Visit: Payer: Self-pay | Admitting: Internal Medicine

## 2019-09-12 MED ORDER — METFORMIN HCL 1000 MG PO TABS: ORAL_TABLET | ORAL | 3 refills | Status: DC

## 2019-09-12 NOTE — Telephone Encounter (Signed)
Pt notified of update will. Pt will go to [facility] in a month to have levels rechecked since she was on Vit D.

## 2019-09-14 ENCOUNTER — Encounter: Payer: Self-pay | Admitting: Family Medicine

## 2019-11-07 ENCOUNTER — Encounter: Payer: Self-pay | Admitting: Family Medicine

## 2019-11-29 NOTE — Addendum Note (Signed)
Addended by: Marrion Coy on: 11/29/2019 03:10 PM   Modules accepted: Orders

## 2019-12-01 ENCOUNTER — Other Ambulatory Visit: Payer: Self-pay

## 2019-12-01 ENCOUNTER — Other Ambulatory Visit: Payer: BC Managed Care – PPO

## 2019-12-01 DIAGNOSIS — R748 Abnormal levels of other serum enzymes: Secondary | ICD-10-CM | POA: Diagnosis not present

## 2019-12-01 DIAGNOSIS — E119 Type 2 diabetes mellitus without complications: Secondary | ICD-10-CM

## 2019-12-05 LAB — HEPATITIS PANEL, ACUTE
Hep A IgM: NONREACTIVE
Hep B C IgM: NONREACTIVE
Hepatitis B Surface Ag: NONREACTIVE
Hepatitis C Ab: NONREACTIVE
SIGNAL TO CUT-OFF: 0.01 (ref <1.00)

## 2019-12-05 LAB — HEPATIC FUNCTION PANEL
AG Ratio: 1.6 (calc) (ref 1.0–2.5)
ALT: 77 U/L — ABNORMAL HIGH (ref 6–29)
AST: 45 U/L — ABNORMAL HIGH (ref 10–35)
Albumin: 4.3 g/dL (ref 3.6–5.1)
Alkaline phosphatase (APISO): 91 U/L (ref 37–153)
Bilirubin, Direct: 0.1 mg/dL (ref 0.0–0.2)
Globulin: 2.7 g/dL (calc) (ref 1.9–3.7)
Indirect Bilirubin: 0.4 mg/dL (calc) (ref 0.2–1.2)
Total Bilirubin: 0.5 mg/dL (ref 0.2–1.2)
Total Protein: 7 g/dL (ref 6.1–8.1)

## 2019-12-05 LAB — GAMMA GT: GGT: 44 U/L (ref 3–70)

## 2019-12-05 LAB — MICROALBUMIN / CREATININE URINE RATIO
Creatinine, Urine: 106 mg/dL (ref 20–275)
Microalb Creat Ratio: 11 mcg/mg creat (ref <30)
Microalb, Ur: 1.2 mg/dL

## 2019-12-18 ENCOUNTER — Encounter: Payer: Self-pay | Admitting: Family Medicine

## 2019-12-18 DIAGNOSIS — R748 Abnormal levels of other serum enzymes: Secondary | ICD-10-CM

## 2019-12-19 ENCOUNTER — Other Ambulatory Visit: Payer: Self-pay

## 2019-12-19 ENCOUNTER — Encounter: Payer: Self-pay | Admitting: Internal Medicine

## 2019-12-19 ENCOUNTER — Ambulatory Visit (INDEPENDENT_AMBULATORY_CARE_PROVIDER_SITE_OTHER): Payer: BC Managed Care – PPO | Admitting: Internal Medicine

## 2019-12-19 VITALS — BP 130/86 | HR 82 | Ht 67.25 in | Wt 219.0 lb

## 2019-12-19 DIAGNOSIS — E1165 Type 2 diabetes mellitus with hyperglycemia: Secondary | ICD-10-CM

## 2019-12-19 LAB — POCT GLYCOSYLATED HEMOGLOBIN (HGB A1C): Hemoglobin A1C: 6.9 % — AB (ref 4.0–5.6)

## 2019-12-19 MED ORDER — METFORMIN HCL 1000 MG PO TABS: ORAL_TABLET | ORAL | 3 refills | Status: DC

## 2019-12-19 MED ORDER — GLUCOSE BLOOD VI STRP
ORAL_STRIP | 12 refills | Status: DC
Start: 2019-12-19 — End: 2021-08-28

## 2019-12-19 NOTE — Patient Instructions (Addendum)
Please increase: - Metformin to 1000 mg 2x day  Start checking sugars every day, rotating check times.  Please let me know if the sugars are consistently <80 or >180.  Please return in 4 months with your sugar log.

## 2019-12-19 NOTE — Progress Notes (Signed)
Patient ID: Cynthia Walters, female   DOB: 1965-07-05, 54 y.o.   MRN: [id number]   This visit occurred during the SARS-CoV-2 public health emergency.  Safety protocols were in place, including screening questions prior to the visit, additional usage of staff PPE, and extensive cleaning of exam room while observing appropriate contact time as indicated for disinfecting solutions.   HPI: Cynthia Walters is a 54 y.o.-year-old female, returning for follow-up for new diagnosis of DM2, hx of GDM in 2000, and then prediabetes dx in 2014, non-insulin-dependent, controlled, without long-term complications.  Last visit 1 year and 6.5 months ago.  Reviewed records: Before I last saw her, she had a higher HbA1c, at 6.8%.  She was then started of Metformin (500 mg with L and D) and referred to endocrinology.  Since the above HbA1c, she started the [facility] Weight Loss >> meal replacement (Optifast); also more exercise.  Also started working with a Physiological scientist.    She was lost to follow-up after her last visit in 05/2018.  She came off the diet afterwards during Covid19 pandemic.  A new HbA1c obtained 3.5 months ago was in the diabetic range: 6.6%.  Reviewed HbA1c levels: Lab Results  Component Value Date   HGBA1C 6.6 (H) 09/01/2019   HGBA1C 6.0 (A) 06/09/2018   HGBA1C 6.1 (A) 11/23/2017   HGBA1C 6.0 (H) 07/21/2016   HGBA1C 6.1 11/29/2015   HGBA1C 6.2 08/12/2015   HGBA1C 6.1 03/20/2015   HGBA1C 5.9 (H) 04/23/2014   HGBA1C 6.0 (H) 01/08/2014   HGBA1C 5.6 10/16/2013  08/30/2017: HbA1c 6.8% Other pertinent labs: 09/21/2017: Glucose 122, insulin 25.4  At last visit, she was checking sugars inconsistently.  Now not checking. From before: - am: 109, 136, 147 - 2h after b'fast: n/c - before lunch: n/c - 2h after lunch: n/c - before dinner: 102 - 2h after dinner: 126 - bedtime: n/c - nighttime: n/c  Glucometer: No  -No CKD, last BUN/creatinine:  Lab Results  Component Value  Date   BUN 14 09/01/2019   BUN 12 11/29/2015   CREATININE 0.69 09/01/2019   CREATININE 0.68 11/29/2015  09/21/2017: Glucose 122, BUN/creatinine 14/0.7, GFR >90  -+ HL; last set of lipids: Lab Results  Component Value Date   CHOL 201 (H) 09/01/2019   HDL 64.90 09/01/2019   LDLCALC 110 (H) 09/01/2019   TRIG 131.0 09/01/2019   CHOLHDL 3 09/01/2019  08/30/2017: 179/103/58/101 She is not on a statin.  - last eye exam was long time ago: No DR -No numbness and tingling in her feet.  Pt has FH of DM in P [place].  ROS: Constitutional: + weight gain/no weight loss, no fatigue, no subjective hyperthermia, no subjective hypothermia Eyes: no blurry vision, no xerophthalmia ENT: no sore throat, no nodules palpated in neck, no dysphagia, no odynophagia, no hoarseness Cardiovascular: no CP/no SOB/no palpitations/no leg swelling Respiratory: no cough/no SOB/no wheezing Gastrointestinal: no N/no V/no D/no C/no acid reflux Musculoskeletal: no muscle aches/no joint aches Skin: no rashes, no hair loss Neurological: no tremors/no numbness/no tingling/no dizziness  I reviewed pt's medications, allergies, PMH, social hx, family hx, and changes were documented in the history of present illness. Otherwise, unchanged from my initial visit note.  Past Medical History:  Diagnosis Date  . Anxiety   . Depression   . Depression with anxiety 01/08/2014  . Hematuria   . Hyperglycemia 03/19/2015  . Hyperlipidemia, mixed 11/29/2015  . Obesity 11/29/2015  . Rosacea   . Vitamin B12 deficiency   .  Vitamin D deficiency    Past Surgical History:  Procedure Laterality Date  . CESAREAN SECTION  2000  . DENTAL SURGERY  01/2011   tooth removed,implant  . WISDOM TOOTH EXTRACTION  age 42   Social History   Socioeconomic History  . Marital status: Divorced    Spouse name: Not on file  . Number of children: 1  . Years of education: 4  . Highest education level: Not on file  Occupational History  . Occupation:  Aeronautical engineer: [employer]  Tobacco Use  . Smoking status: Never Smoker  . Smokeless tobacco: Never Used  Vaping Use  . Vaping Use: Never used  Substance and Sexual Activity  . Alcohol use: Yes    Alcohol/week: 5.0 standard drinks    Types: 5 Glasses of wine per week    Comment: occassional wine with dinner  . Drug use: No  . Sexual activity: Yes    Partners: Male    Birth control/protection: Pill  Other Topics Concern  . Not on file  Social History Narrative   Marital status:  Divorced, Dating x 5 years; no abuse.      Children: one son (7 yo)      lives with:   39 year old son and her boyfriend.      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      Tobacco:  No tobacco.      Alcohol:  Beer/wine occasionally      Exercise:  Not currently exercising   Social Determinants of Health   Financial Resource Strain:   . Difficulty of Paying Living Expenses: Not on file  Food Insecurity:   . Worried About Charity fundraiser in the Last Year: Not on file  . Ran Out of Food in the Last Year: Not on file  Transportation Needs:   . Lack of Transportation (Medical): Not on file  . Lack of Transportation (Non-Medical): Not on file  Physical Activity:   . Days of Exercise per Week: Not on file  . Minutes of Exercise per Session: Not on file  Stress:   . Feeling of Stress : Not on file  Social Connections:   . Frequency of Communication with Friends and Family: Not on file  . Frequency of Social Gatherings with Friends and Family: Not on file  . Attends Religious Services: Not on file  . Active Member of Clubs or Organizations: Not on file  . Attends Archivist Meetings: Not on file  . Marital Status: Not on file  Intimate Partner Violence:   . Fear of Current or Ex-Partner: Not on file  . Emotionally Abused: Not on file  . Physically Abused: Not on file  . Sexually Abused: Not on file   Current  Outpatient Medications on File Prior to Visit  Medication Sig Dispense Refill  . cholecalciferol (VITAMIN D) 1000 UNITS tablet Take 1,000 Units by mouth daily.    Marland Kitchen FLUoxetine (PROZAC) 20 MG capsule Take 20 mg by mouth daily.    Marland Kitchen glucose blood test strip Use as instructed once a day - for One[employer] IQ meter 100 each 12  . Lancets (ONETOUCH DELICA PLUS [id number]) MISC 1 each by Does not apply route daily. Use to check blood sugar once a day. 100 each 12  . metFORMIN (GLUCOPHAGE) 1000 MG tablet TAKE ONE TABLET BY MOUTH ONE TIME DAILY  with supper  90 tablet 3   No current facility-administered medications on file prior to visit.   No Known Allergies Family History  Problem Relation Age of Onset  . Thyroid disease Mother   . Bipolar disorder Father   . Obesity Father   . Hearing loss Father        does run in family  . Hypertension Maternal Grandmother   . Heart attack Maternal Grandmother 83  . Stroke Paternal Grandmother 78  . Diabetes Paternal Grandmother   . Stroke Paternal Grandfather 43  . Depression Brother   . Depression Brother   . ALS Maternal Grandfather    PE: BP 130/86   Pulse 82   Ht 5' 7.25" (1.708 m)   Wt 219 lb (99.3 kg)   SpO2 97%   BMI 34.05 kg/m  Wt Readings from Last 3 Encounters:  12/19/19 219 lb (99.3 kg)  09/01/19 218 lb 6.4 oz (99.1 kg)  06/09/18 213 lb (96.6 kg)   Constitutional: overweight, in NAD Eyes: PERRLA, EOMI, no exophthalmos ENT: moist mucous membranes, no thyromegaly, no cervical lymphadenopathy Cardiovascular: RRR, No MRG Respiratory: CTA B Gastrointestinal: abdomen soft, NT, ND, BS+ Musculoskeletal: no deformities, strength intact in all 4 Skin: moist, warm, no rashes Neurological: no tremor with outstretched hands, DTR normal in all 4  ASSESSMENT: 1.  DM2, non-insulin-dependent, new diagnosis  2. Obesity  3. HL  PLAN:  1. Patient with history of prediabetes at least since 2014 and a previous history of gestational  diabetes in 2000.  She had an HbA1c at 6.8% in 08/2017, but afterwards, she started to go to the [facility] weight loss center, also started to exercise and started Metformin.  HbA1c improved.  At last visit, it was 6.0%.  We moved her entire Metformin dose with dinner, 1000 mg daily.  However, she was lost for labs afterwards.  A more recent HbA1c obtained in 08/2019 was higher, at 6.6%, back in the diabetic range.  With 3 values higher than 6.4%, she is now considered to have diabetes.  At today's visit, HbA1c is even higher, at 6.9%. -At today's visit, she tells me she is not checking sugars at all.  She does have a working glucometer, One Touch verio, and I advised her to start checking sugars daily, rotating check times.  I sent a prescription for strips to her pharmacy -I also advised her to increase Metformin to 1000 mg twice a day, with meals, since she is tolerating the 1000 mg tablet well, without nausea or other GI symptoms -She is aware that she needs to start improving her diet and we discussed about different ways to do that.  I also suggested to start with light exercise, which she can then increase.  She is interested in intermittent fasting, which her brother implemented with great success.  I advised her to do so and given her references about this.  We discussed about the best eating and fasting windows. - I suggested to:  Patient Instructions  Please increase: - Metformin to 1000 mg 2x day  Start checking sugars every day, rotating check times.  Please let me know if the sugars are consistently <80 or >180.  Please return in 4 months with your sugar log.   - we checked her HbA1c: 6.9% (higher) - advised to check sugars at different times of the day - 1x a day, rotating check times - return to clinic in 4 months   2. Obesity -Previously on Optifast diet -At last visit,  she joined [employer] and working with a Physiological scientist and then also joined a walk-run low -I advised her to  let me know if she needed a referral to the weight management clinic at [facility], but she did not contact me about this since last visit -She tells me that her diet actually improved at the beginning of the pandemic as she was not eating out, but then worsened when she returned to work. -She is not interested to go back to the [employer] -We discussed briefly about the weight watchers diet and the [place] diet -For now, she is interested in intermittent fasting -At next visit, adding a GLP-1 receptor agonist is an option.  She would like to hold off doing that and I agree. -she gained approximately 5 pounds since last visit  3. HL - Reviewed latest lipid panel from 08/2019: LDL above target, the rest of the fractions at goal: Lab Results  Component Value Date   CHOL 201 (H) 09/01/2019   HDL 64.90 09/01/2019   LDLCALC 110 (H) 09/01/2019   TRIG 131.0 09/01/2019   CHOLHDL 3 09/01/2019  -She is not on a statin   Philemon Kingdom, MD PhD [facility]

## 2019-12-26 ENCOUNTER — Other Ambulatory Visit: Payer: BC Managed Care – PPO

## 2020-01-02 ENCOUNTER — Ambulatory Visit
Admission: RE | Admit: 2020-01-02 | Discharge: 2020-01-02 | Disposition: A | Discharge status: Home / Self Care | Payer: BC Managed Care – PPO | Source: Ambulatory Visit | Attending: Family Medicine | Admitting: Family Medicine

## 2020-01-02 DIAGNOSIS — K7689 Other specified diseases of liver: Secondary | ICD-10-CM | POA: Diagnosis not present

## 2020-01-02 DIAGNOSIS — R748 Abnormal levels of other serum enzymes: Secondary | ICD-10-CM

## 2020-01-18 ENCOUNTER — Telehealth: Payer: Self-pay | Admitting: *Deleted

## 2020-01-18 ENCOUNTER — Telehealth: Payer: Self-pay | Admitting: Family Medicine

## 2020-01-18 NOTE — Telephone Encounter (Signed)
Pt returned the call to the office. 

## 2020-01-18 NOTE — Telephone Encounter (Signed)
Patient called returning [place] call. Please call pt

## 2020-01-18 NOTE — Telephone Encounter (Signed)
See results note. 

## 2020-02-01 DIAGNOSIS — R933 Abnormal findings on diagnostic imaging of other parts of digestive tract: Secondary | ICD-10-CM | POA: Diagnosis not present

## 2020-02-01 DIAGNOSIS — R748 Abnormal levels of other serum enzymes: Secondary | ICD-10-CM | POA: Diagnosis not present

## 2020-03-01 ENCOUNTER — Ambulatory Visit: Payer: BC Managed Care – PPO | Admitting: Family Medicine

## 2020-03-19 ENCOUNTER — Ambulatory Visit (INDEPENDENT_AMBULATORY_CARE_PROVIDER_SITE_OTHER): Payer: BC Managed Care – PPO | Admitting: Obstetrics and Gynecology

## 2020-03-19 ENCOUNTER — Other Ambulatory Visit: Payer: Self-pay

## 2020-03-19 ENCOUNTER — Encounter: Payer: Self-pay | Admitting: Obstetrics and Gynecology

## 2020-03-19 VITALS — BP 112/70 | HR 92 | Resp 16 | Ht 67.0 in | Wt 215.0 lb

## 2020-03-19 DIAGNOSIS — N912 Amenorrhea, unspecified: Secondary | ICD-10-CM

## 2020-03-19 DIAGNOSIS — Z01419 Encounter for gynecological examination (general) (routine) without abnormal findings: Secondary | ICD-10-CM | POA: Diagnosis not present

## 2020-03-19 NOTE — Progress Notes (Addendum)
54 y.o. G27P1001 Divorced Caucasian female here for annual exam.    She is concerned about her weight and her A1C, which is 6.9.  New dx of nonalcoholic fatty liver disease.  Sees Dr. Collene Mares.   She has had nutrition counseling.   Some urinary urgency.  No urinary incontinence.  No dysuria or hematuria.  Drinks tea.   She stopped her birth control pills in 2019 and her Leon was 8.5.  She did not restart them.   Has no hot flashes but was hot all the time.   She stopped taking Prozac due to concerns about her liver.  Is doing OK off of this.  Completed Covid booster and flu vaccine.   Son is 6 yo.  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   PCP:  Caren Macadam, MD   Patient's last menstrual period was 10/31/2018 (within weeks).           Sexually active: Yes.    The current method of family planning is vasectomy.    Exercising: No.  The patient does not participate in regular exercise at present. Smoker:  no  Health Maintenance: Pap:   07/22/17 Neg:Neg HR HPV  07/03/14 Neg:Neg HR HPV History of abnormal Pap:  no MMG:  04/20/19 BIRADS 1 negative/density b Colonoscopy:  10/29/17 f/u 5-10 years BMD:   none  TDaP:  2013 Gardasil:   n/a HIV: never Hep C: 12/01/19 Negative Screening Labs: discuss today   reports that she has never smoked. She has never used smokeless tobacco. She reports current alcohol use of about 5.0 standard drinks of alcohol per week. She reports that she does not use drugs.  Past Medical History:  Diagnosis Date  . Anxiety   . Depression   . Depression with anxiety 01/08/2014  . Hematuria   . Hyperglycemia 03/19/2015  . Hyperlipidemia, mixed 11/29/2015  . Obesity 11/29/2015  . Rosacea   . Vitamin B12 deficiency   . Vitamin D deficiency     Past Surgical History:  Procedure Laterality Date  . CESAREAN SECTION  2000  . DENTAL SURGERY  01/2011   tooth removed,implant  . WISDOM TOOTH EXTRACTION  age 18    Current Outpatient  Medications  Medication Sig Dispense Refill  . glucose blood test strip Use as instructed once a day - for OneTouch Verio IQ meter 100 each 12  . Lancets (ONETOUCH DELICA PLUS [id number]) MISC 1 each by Does not apply route daily. Use to check blood sugar once a day. 100 each 12  . metFORMIN (GLUCOPHAGE) 1000 MG tablet TAKE 1 TABLET BY MOUTH 2x a day with meals 180 tablet 3   No current facility-administered medications for this visit.    Family History  Problem Relation Age of Onset  . Thyroid disease Mother   . Bipolar disorder Father   . Obesity Father   . Hearing loss Father        does run in family  . Hypertension Maternal Grandmother   . Heart attack Maternal Grandmother 83  . Stroke Paternal Grandmother 23  . Diabetes Paternal Grandmother   . Stroke Paternal Grandfather 30  . Depression Brother   . Depression Brother   . ALS Maternal Grandfather     Review of Systems  Constitutional: Negative.   HENT: Negative.   Eyes: Negative.   Respiratory: Negative.   Cardiovascular: Negative.   Gastrointestinal: Negative.   Endocrine: Negative.   Genitourinary: Negative.   Musculoskeletal: Negative.  Skin: Negative.   Allergic/Immunologic: Negative.   Neurological: Negative.   Hematological: Negative.   Psychiatric/Behavioral: Negative.     Exam:   BP 112/70 (BP Location: Right Arm, Patient Position: Sitting, Cuff Size: Normal)   Pulse 92   Resp 16   Ht 5\' 7"  (1.702 m)   Wt 215 lb (97.5 kg)   LMP 10/31/2018 (Within Weeks)   BMI 33.67 kg/m     General appearance: alert, cooperative and appears stated age Head: normocephalic, without obvious abnormality, atraumatic Neck: no adenopathy, supple, symmetrical, trachea midline and thyroid normal to inspection and palpation Lungs: clear to auscultation bilaterally Breasts: normal appearance, no masses or tenderness, No nipple retraction or dimpling, No nipple discharge or bleeding, No axillary adenopathy Heart: regular  rate and rhythm Abdomen: soft, non-tender; no masses, no organomegaly Extremities: extremities normal, atraumatic, no cyanosis or edema Skin: skin color, texture, turgor normal. No rashes or lesions.  Umbilical pigmented nevus. Lymph nodes: cervical, supraclavicular, and axillary nodes normal. Neurologic: grossly normal  Pelvic: External genitalia:  no lesions              No abnormal inguinal nodes palpated.              Urethra:  normal appearing urethra with no masses, tenderness or lesions              Bartholins and Skenes: normal                 Vagina: normal appearing vagina with normal color and discharge, no lesions              Cervix: no lesions              Pap taken: No. Bimanual Exam:  Uterus:  normal size, contour, position, consistency, mobility, non-tender              Adnexa: no mass, fullness, tenderness              Rectal exam: Yes.  .  Confirms.              Anus:  normal sphincter tone, no lesions  Chaperone was present for exam.  Assessment:   Well woman visit with normal exam. Amenorrhea.  I suspect menopause.  Urinary urgency.  Diabetes mellitus. Pigmented nevus.   Plan: Mammogram screening discussed. Self breast awareness reviewed. Pap and HR HPV as above. Guidelines for Calcium, Vitamin D, regular exercise program including cardiovascular and weight bearing exercise. Check FSH and E2.  Avoid bladder irritants. She will see dermatology.  Follow up annually and prn.

## 2020-03-19 NOTE — Patient Instructions (Signed)

## 2020-03-21 LAB — FOLLICLE STIMULATING HORMONE: FSH: 46.4 m[IU]/mL

## 2020-03-21 LAB — ESTRADIOL: Estradiol: 14.7 pg/mL

## 2020-04-12 ENCOUNTER — Encounter: Payer: Self-pay | Admitting: Family Medicine

## 2020-04-12 ENCOUNTER — Other Ambulatory Visit: Payer: Self-pay

## 2020-04-12 ENCOUNTER — Ambulatory Visit (INDEPENDENT_AMBULATORY_CARE_PROVIDER_SITE_OTHER): Payer: BC Managed Care – PPO | Admitting: Family Medicine

## 2020-04-12 VITALS — BP 102/80 | HR 85 | Temp 98.0°F | Ht 67.0 in | Wt 217.2 lb

## 2020-04-12 DIAGNOSIS — E538 Deficiency of other specified B group vitamins: Secondary | ICD-10-CM

## 2020-04-12 DIAGNOSIS — E782 Mixed hyperlipidemia: Secondary | ICD-10-CM | POA: Diagnosis not present

## 2020-04-12 DIAGNOSIS — E559 Vitamin D deficiency, unspecified: Secondary | ICD-10-CM

## 2020-04-12 DIAGNOSIS — K760 Fatty (change of) liver, not elsewhere classified: Secondary | ICD-10-CM | POA: Diagnosis not present

## 2020-04-12 DIAGNOSIS — E118 Type 2 diabetes mellitus with unspecified complications: Secondary | ICD-10-CM | POA: Diagnosis not present

## 2020-04-12 DIAGNOSIS — R0683 Snoring: Secondary | ICD-10-CM

## 2020-04-12 DIAGNOSIS — R5383 Other fatigue: Secondary | ICD-10-CM

## 2020-04-12 LAB — TSH: TSH: 2.53 u[IU]/mL (ref 0.35–4.50)

## 2020-04-12 LAB — COMPREHENSIVE METABOLIC PANEL
ALT: 49 U/L — ABNORMAL HIGH (ref 0–35)
AST: 32 U/L (ref 0–37)
Albumin: 4.6 g/dL (ref 3.5–5.2)
Alkaline Phosphatase: 98 U/L (ref 39–117)
BUN: 16 mg/dL (ref 6–23)
CO2: 26 mEq/L (ref 19–32)
Calcium: 9.5 mg/dL (ref 8.4–10.5)
Chloride: 104 mEq/L (ref 96–112)
Creatinine, Ser: 0.75 mg/dL (ref 0.40–1.20)
GFR: 91.33 mL/min (ref >60.00)
Glucose, Bld: 134 mg/dL — ABNORMAL HIGH (ref 70–99)
Potassium: 4.5 mEq/L (ref 3.5–5.1)
Sodium: 138 mEq/L (ref 135–145)
Total Bilirubin: 0.7 mg/dL (ref 0.2–1.2)
Total Protein: 7.2 g/dL (ref 6.0–8.3)

## 2020-04-12 LAB — CBC WITH DIFFERENTIAL/PLATELET
Basophils Absolute: 0 10*3/uL (ref 0.0–0.1)
Basophils Relative: 0.6 % (ref 0.0–3.0)
Eosinophils Absolute: 0.3 10*3/uL (ref 0.0–0.7)
Eosinophils Relative: 4.6 % (ref 0.0–5.0)
HCT: 41.9 % (ref 36.0–46.0)
Hemoglobin: 14.3 g/dL (ref 12.0–15.0)
Lymphocytes Relative: 28.5 % (ref 12.0–46.0)
Lymphs Abs: 1.7 10*3/uL (ref 0.7–4.0)
MCHC: 34.2 g/dL (ref 30.0–36.0)
MCV: 86.2 fl (ref 78.0–100.0)
Monocytes Absolute: 0.5 10*3/uL (ref 0.1–1.0)
Monocytes Relative: 8 % (ref 3.0–12.0)
Neutro Abs: 3.5 10*3/uL (ref 1.4–7.7)
Neutrophils Relative %: 58.3 % (ref 43.0–77.0)
Platelets: 380 10*3/uL (ref 150.0–400.0)
RBC: 4.86 Mil/uL (ref 3.87–5.11)
RDW: 13.3 % (ref 11.5–15.5)
WBC: 6.1 10*3/uL (ref 4.0–10.5)

## 2020-04-12 LAB — LIPID PANEL
Cholesterol: 195 mg/dL (ref 0–200)
HDL: 66.1 mg/dL (ref >39.00)
LDL Cholesterol: 109 mg/dL — ABNORMAL HIGH (ref 0–99)
NonHDL: 129.23
Total CHOL/HDL Ratio: 3
Triglycerides: 102 mg/dL (ref 0.0–149.0)
VLDL: 20.4 mg/dL (ref 0.0–40.0)

## 2020-04-12 LAB — MICROALBUMIN / CREATININE URINE RATIO
Creatinine,U: 84.2 mg/dL
Microalb Creat Ratio: 1.9 mg/g (ref 0.0–30.0)
Microalb, Ur: 1.6 mg/dL (ref 0.0–1.9)

## 2020-04-12 LAB — VITAMIN D 25 HYDROXY (VIT D DEFICIENCY, FRACTURES): VITD: 46.51 ng/mL (ref 30.00–100.00)

## 2020-04-12 LAB — VITAMIN B12: Vitamin B-12: 416 pg/mL (ref 211–911)

## 2020-04-12 LAB — HEMOGLOBIN A1C: Hgb A1c MFr Bld: 7 % — ABNORMAL HIGH (ref 4.6–6.5)

## 2020-04-12 NOTE — Patient Instructions (Addendum)
Consider [employer] or Hello Fresh.   Consider online exercise like beach body.  Fatty Liver Disease  The liver converts food into energy, removes toxic material from the blood, makes important proteins, and absorbs necessary vitamins from food. Fatty liver disease occurs when too much fat has built up in your liver cells. Fatty liver disease is also called hepatic steatosis. In many cases, fatty liver disease does not cause symptoms or problems. It is often diagnosed when tests are being done for other reasons. However, over time, fatty liver can cause inflammation that may lead to more serious liver problems, such as scarring of the liver (cirrhosis) and liver failure. Fatty liver is associated with insulin resistance, increased body fat, high blood pressure (hypertension), and high cholesterol. These are features of metabolic syndrome and increase your risk for stroke, diabetes, and heart disease. What are the causes? This condition may be caused by components of metabolic syndrome:  Obesity.  Insulin resistance.  High cholesterol. Other causes:  Alcohol abuse.  Poor nutrition.  Cushing syndrome.  Pregnancy.  Certain drugs.  Poisons.  Some viral infections. What increases the risk? You are more likely to develop this condition if you:  Abuse alcohol.  Are overweight.  Have diabetes.  Have hepatitis.  Have a high triglyceride level.  Are pregnant. What are the signs or symptoms? Fatty liver disease often does not cause symptoms. If symptoms do develop, they can include:  Fatigue and weakness.  Weight loss.  Confusion.  Nausea, vomiting, or abdominal pain.  Yellowing of your skin and the white parts of your eyes (jaundice).  Itchy skin. How is this diagnosed? This condition may be diagnosed by:  A physical exam and your medical history.  Blood tests.  Imaging tests, such as an ultrasound, CT scan, or MRI.  A liver biopsy. A small sample of liver  tissue is removed using a needle. The sample is then looked at under a microscope. How is this treated? Fatty liver disease is often caused by other health conditions. Treatment for fatty liver may involve medicines and lifestyle changes to manage conditions such as:  Alcoholism.  High cholesterol.  Diabetes.  Being overweight or obese. Follow these instructions at home:  Do not drink alcohol. If you have trouble quitting, ask your health care provider how to safely quit with the help of medicine or a supervised program. This is important to keep your condition from getting worse.  Eat a healthy diet as told by your health care provider. Ask your health care provider about working with a dietitian to develop an eating plan.  Exercise regularly. This can help you lose weight and control your cholesterol and diabetes. Talk to your health care provider about an exercise plan and which activities are best for you.  Take over-the-counter and prescription medicines only as told by your health care provider.  Keep all follow-up visits. This is important.   Contact a health care provider if:  You have trouble controlling your: ? Blood sugar. This is especially important if you have diabetes. ? Cholesterol. ? Drinking of alcohol. Get help right away if:  You have abdominal pain.  You have jaundice.  You have nausea and are vomiting.  You vomit blood or material that looks like coffee grounds.  You have stools that are black, tar-like, or bloody. Summary  Fatty liver disease develops when too much fat builds up in the cells of your liver.  Fatty liver disease often causes no symptoms or problems.  However, over time, fatty liver can cause inflammation that may lead to more serious liver problems, such as scarring of the liver (cirrhosis).  You are more likely to develop this condition if you abuse alcohol, are pregnant, are overweight, have diabetes, have hepatitis, or have high  triglyceride or cholesterol levels.  Contact your health care provider if you have trouble controlling your blood sugar, cholesterol, or drinking of alcohol. This information is not intended to replace advice given to you by your health care provider. Make sure you discuss any questions you have with your health care provider. Document Revised: 12/28/2019 Document Reviewed: 12/28/2019 Elsevier Patient Education  [place].

## 2020-04-12 NOTE — Progress Notes (Signed)
Cynthia Walters DOB: June 05, 1965 Encounter date: 04/12/2020  This is a 55 y.o. female who presents with Chief Complaint  Patient presents with  . Follow-up    History of present illness: Liver enzymes had been elevated; talked with GI; had scan. Recommended diet/exercise to help with this. Has been reading book "skinny liver". Has been trying to watch what she is eating. Trying to make plans with her eating so that she isn't making impulse decisions. Knows that numbers have been high for a few years; found out as far back as 2019.  Trying to walk some. Doesn't like gyms. Has a hard time finding something she likes to do.  Worries about blood sugar- still eating out more. She didn't tolerate the 2 metformin daily. Too much stomach upset.   Mood has been ok; but feels somewhat stuck - maybe COVID; just tired of going to work and coming home.   She does snore. Does have some morning fatigue.    No Known Allergies Current Meds  Medication Sig  . glucose blood test strip Use as instructed once a day - for [employer] IQ meter  . Lancets (ONETOUCH DELICA PLUS [id number]) MISC 1 each by Does not apply route daily. Use to check blood sugar once a day.  . metFORMIN (GLUCOPHAGE) 1000 MG tablet TAKE 1 TABLET BY MOUTH 2x a day with meals (Patient taking differently: daily with breakfast. TAKE 1 TABLET BY MOUTH 2x a day with meals)    Review of Systems  Constitutional: Negative for chills, fatigue and fever.  Respiratory: Negative for cough, chest tightness, shortness of breath and wheezing.   Cardiovascular: Negative for chest pain, palpitations and leg swelling.    Objective:  BP 102/80 (BP Location: Left Arm, Patient Position: Sitting, Cuff Size: Large)   Pulse 85   Temp 98 F (36.7 C) (Oral)   Ht 5\' 7"  (1.702 m)   Wt 217 lb 3.2 oz (98.5 kg)   BMI 34.02 kg/m   Weight: 217 lb 3.2 oz (98.5 kg)   BP Readings from Last 3 Encounters:  04/12/20 102/80  03/19/20 112/70   12/19/19 130/86   Wt Readings from Last 3 Encounters:  04/12/20 217 lb 3.2 oz (98.5 kg)  03/19/20 215 lb (97.5 kg)  12/19/19 219 lb (99.3 kg)    Physical Exam Constitutional:      General: She is not in acute distress.    Appearance: She is well-developed.  Cardiovascular:     Rate and Rhythm: Normal rate and regular rhythm.     Heart sounds: Normal heart sounds. No murmur heard. No friction rub.  Pulmonary:     Effort: Pulmonary effort is normal. No respiratory distress.     Breath sounds: Normal breath sounds. No wheezing or rales.  Musculoskeletal:     Right lower leg: No edema.     Left lower leg: No edema.  Neurological:     Mental Status: She is alert and oriented to person, place, and time.  Psychiatric:        Behavior: Behavior normal.    Assessment/Plan  1. Fatty liver She is working on [employer]. She hasn't found exercise routine she likes yet, but we talked through some options today.   2. Hyperlipidemia, mixed  - Lipid panel; Future  3. B12 deficiency  - Vitamin B12; Future  4. Vitamin D deficiency  - VITAMIN D 25 Hydroxy (Vit-D Deficiency, Fractures); Future  5. Snoring We discussed side effects of untreated apnea; would like  to rule this out  - Ambulatory referral to Sleep Studies  6. Fatigue, unspecified type See above; labwork and sleep eval. - CBC with Differential/Platelet; Future - Comprehensive metabolic panel; Future - TSH; Future - Ambulatory referral to Sleep Studies  7. Controlled type 2 diabetes mellitus with complication, without long-term current use of insulin (HCC) Unable to tolerated 2 metformin daily due to loose stools. Working on diet. She is following with endocrinology, Dr. Renne Walters. - Hemoglobin A1c; Future - Microalbumin / creatinine urine ratio; Future   Return in about 6 months (around 10/10/2020) for physical exam.    [name], MD

## 2020-04-19 ENCOUNTER — Ambulatory Visit: Payer: BC Managed Care – PPO | Admitting: Internal Medicine

## 2020-05-27 ENCOUNTER — Ambulatory Visit: Payer: BC Managed Care – PPO | Admitting: Internal Medicine

## 2020-07-02 ENCOUNTER — Telehealth: Payer: Self-pay | Admitting: Internal Medicine

## 2020-07-02 NOTE — Telephone Encounter (Signed)
Pt can reschedule appt. Please let her know any further cancellations will result in dismissal.

## 2020-07-02 NOTE — Telephone Encounter (Signed)
Patient has cancelled twice in a row now since Jan of this year. She just called asking if she could reschedule tomorrow's appointment and I informed her that due to the policy I would have to ask Dr Cruzita Lederer before we could reschedule. Please advise. Patient said I could send her a mychart message to let her know the response.

## 2020-07-02 NOTE — Telephone Encounter (Signed)
Will do, sending patient mychart message now. Thank you

## 2020-07-02 NOTE — Telephone Encounter (Signed)
Ok but this would be the last time.  She has more cancellations and appointments.

## 2020-07-02 NOTE — Telephone Encounter (Signed)
Please advise 

## 2020-07-03 ENCOUNTER — Other Ambulatory Visit: Payer: Self-pay

## 2020-07-03 ENCOUNTER — Ambulatory Visit: Payer: BC Managed Care – PPO | Admitting: Internal Medicine

## 2020-07-03 ENCOUNTER — Ambulatory Visit (INDEPENDENT_AMBULATORY_CARE_PROVIDER_SITE_OTHER): Payer: BC Managed Care – PPO | Admitting: Internal Medicine

## 2020-07-03 ENCOUNTER — Encounter: Payer: Self-pay | Admitting: Internal Medicine

## 2020-07-03 VITALS — BP 120/82 | HR 93 | Ht 67.0 in | Wt 215.6 lb

## 2020-07-03 DIAGNOSIS — E782 Mixed hyperlipidemia: Secondary | ICD-10-CM | POA: Diagnosis not present

## 2020-07-03 DIAGNOSIS — E1165 Type 2 diabetes mellitus with hyperglycemia: Secondary | ICD-10-CM | POA: Diagnosis not present

## 2020-07-03 MED ORDER — OZEMPIC (0.25 OR 0.5 MG/DOSE) 2 MG/1.5ML ~~LOC~~ SOPN: 0.5000 mg | PEN_INJECTOR | SUBCUTANEOUS | 3 refills | Status: DC

## 2020-07-03 NOTE — Progress Notes (Signed)
Patient ID: Cynthia Walters, female   DOB: 1966/01/12, 55 y.o.   MRN: [id number]   This visit occurred during the SARS-CoV-2 public health emergency.  Safety protocols were in place, including screening questions prior to the visit, additional usage of staff PPE, and extensive cleaning of exam room while observing appropriate contact time as indicated for disinfecting solutions.   HPI: Cynthia Walters is a 55 y.o.-year-old female, returning for follow-up for new diagnosis of DM2, hx of GDM in 2000, and then prediabetes dx in 2014, non-insulin-dependent, controlled, without long-term complications.  Last visit  7 months ago.  Interim hx: Since last visit, she started Metformin.  She initially had nausea with it and had to decrease the dose but then she was able to increase it again and now she tolerates it well, but she sometimes forgets to take it. She is checking sugars inconsistently, no checks in the last months. She would be interested in a GLP-1 receptor agonist as discussed at last visit.  Reviewed records: Before I last saw her, she had a higher HbA1c, at 6.8%.  She was then started of Metformin (500 mg with L and D) and referred to endocrinology.  Since the above HbA1c, she started the [facility] >> meal replacement (Optifast); also more exercise.  Also started working with a Physiological scientist.    She was lost to follow-up after her last visit in 05/2018.  She came off the diet afterwards during Covid19 pandemic.  A new HbA1c obtained 3.5 months ago was in the diabetic range: 6.6%.  She is on: - Metformin 1000 mg 2x a day - increased 11/2019.  Reviewed HbA1c levels: Lab Results  Component Value Date   HGBA1C 7.0 (H) 04/12/2020   HGBA1C 6.9 (A) 12/19/2019   HGBA1C 6.6 (H) 09/01/2019   HGBA1C 6.0 (A) 06/09/2018   HGBA1C 6.1 (A) 11/23/2017   HGBA1C 6.0 (H) 07/21/2016   HGBA1C 6.1 11/29/2015   HGBA1C 6.2 08/12/2015   HGBA1C 6.1 03/20/2015   HGBA1C 5.9  (H) 04/23/2014  08/30/2017: HbA1c 6.8% Other pertinent labs: 09/21/2017: Glucose 122, insulin 25.4  She is checking sugars inconsistently - not in last month: - am: 109, 136, 147 >> 133, 147, 168 - 2h after b'fast: n/c - before lunch: n/c >> 109-127 - 2h after lunch: n/c - before dinner: 107 - 2h after dinner: 126 - bedtime: n/c - nighttime: n/c  Glucometer: One Touch Verio IQ  -No CKD, last BUN/creatinine:  Lab Results  Component Value Date   BUN 16 04/12/2020   BUN 14 09/01/2019   CREATININE 0.75 04/12/2020   CREATININE 0.69 09/01/2019  09/21/2017: Glucose 122, BUN/creatinine 14/0.7, GFR >90  -+ HL; last set of lipids: Lab Results  Component Value Date   CHOL 195 04/12/2020   HDL 66.10 04/12/2020   LDLCALC 109 (H) 04/12/2020   TRIG 102.0 04/12/2020   CHOLHDL 3 04/12/2020  08/30/2017: 179/103/58/101 She is not on a statin.  - last eye exam was long time ago: No DR -No numbness and tingling in her feet.  She has fatty liver with transaminitis: Lab Results  Component Value Date   ALT 49 (H) 04/12/2020   AST 32 04/12/2020   ALKPHOS 98 04/12/2020   BILITOT 0.7 04/12/2020  She sees Dr. Collene Mares.  Pt has FH of DM in [place].  No h/o pancreatitis, no FH of MTC.  ROS: Constitutional: no weight gain/no weight loss, no fatigue, no subjective hyperthermia, no subjective hypothermia Eyes: no  blurry vision, no xerophthalmia ENT: no sore throat, no nodules palpated in neck, no dysphagia, no odynophagia, no hoarseness Cardiovascular: no CP/no SOB/no palpitations/no leg swelling Respiratory: no cough/no SOB/no wheezing Gastrointestinal: no N/no V/no D/no C/no acid reflux Musculoskeletal: no muscle aches/no joint aches Skin: no rashes, no hair loss Neurological: no tremors/no numbness/no tingling/no dizziness  I reviewed pt's medications, allergies, PMH, social hx, family hx, and changes were documented in the history of present illness. Otherwise, unchanged from my initial visit  note.  Past Medical History:  Diagnosis Date  . Anxiety   . Depression   . Depression with anxiety 01/08/2014  . Hematuria   . Hyperglycemia 03/19/2015  . Hyperlipidemia, mixed 11/29/2015  . Obesity 11/29/2015  . Rosacea   . Vitamin B12 deficiency   . Vitamin D deficiency    Past Surgical History:  Procedure Laterality Date  . CESAREAN SECTION  2000  . DENTAL SURGERY  01/2011   tooth removed,implant  . WISDOM TOOTH EXTRACTION  age 74   Social History   Socioeconomic History  . Marital status: Divorced    Spouse name: Not on file  . Number of children: 1  . Years of education: 51  . Highest education level: Not on file  Occupational History  . Occupation: Aeronautical engineer: [employer]  Tobacco Use  . Smoking status: Never Smoker  . Smokeless tobacco: Never Used  Vaping Use  . Vaping Use: Never used  Substance and Sexual Activity  . Alcohol use: Yes    Alcohol/week: 5.0 standard drinks    Types: 5 Glasses of wine per week    Comment: occassional wine with dinner  . Drug use: No  . Sexual activity: Yes    Partners: Male    Birth control/protection: Surgical    Comment: Vasectomy  Other Topics Concern  . Not on file  Social History Narrative   Marital status:  Divorced, Dating x 5 years; no abuse.      Children: one son (46 yo)      lives with:   84 year old son and her boyfriend.      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      Tobacco:  No tobacco.      Alcohol:  Beer/wine occasionally      Exercise:  Not currently exercising   Social Determinants of Health   Financial Resource Strain: Not on file  Food Insecurity: Not on file  Transportation Needs: Not on file  Physical Activity: Not on file  Stress: Not on file  Social Connections: Not on file  Intimate Partner Violence: Not on file   Current Outpatient Medications on File Prior to Visit  Medication Sig Dispense Refill  . glucose  blood test strip Use as instructed once a day - for OneTouch Verio IQ meter 100 each 12  . Lancets (ONETOUCH DELICA PLUS [id number]) MISC 1 each by Does not apply route daily. Use to check blood sugar once a day. 100 each 12  . metFORMIN (GLUCOPHAGE) 1000 MG tablet TAKE 1 TABLET BY MOUTH 2x a day with meals (Patient taking differently: daily with breakfast. TAKE 1 TABLET BY MOUTH 2x a day with meals) 180 tablet 3   No current facility-administered medications on file prior to visit.   No Known Allergies Family History  Problem Relation Age of Onset  . Thyroid disease Mother   . Bipolar disorder Father   .  Obesity Father   . Hearing loss Father        does run in family  . Hypertension Maternal Grandmother   . Heart attack Maternal Grandmother 83  . Stroke Paternal Grandmother 9  . Diabetes Paternal Grandmother   . Stroke Paternal Grandfather 49  . Depression Brother   . Depression Brother   . ALS Maternal Grandfather    PE: BP 120/82 (BP Location: Right Arm, Patient Position: Sitting, Cuff Size: Normal)   Pulse 93   Ht 5\' 7"  (1.702 m)   Wt 215 lb 9.6 oz (97.8 kg)   SpO2 96%   BMI 33.77 kg/m  Wt Readings from Last 3 Encounters:  07/03/20 215 lb 9.6 oz (97.8 kg)  04/12/20 217 lb 3.2 oz (98.5 kg)  03/19/20 215 lb (97.5 kg)   Constitutional: overweight, in NAD Eyes: PERRLA, EOMI, no exophthalmos ENT: moist mucous membranes, no thyromegaly, no cervical lymphadenopathy Cardiovascular: RRR, No MRG Respiratory: CTA B Gastrointestinal: abdomen soft, NT, ND, BS+ Musculoskeletal: no deformities, strength intact in all 4 Skin: moist, warm, no rashes Neurological: no tremor with outstretched hands, DTR normal in all 4  ASSESSMENT: 1.  DM2, non-insulin-dependent  2. Obesity class I  3. HL  PLAN:  1. Patient with history of prediabetes at least since 2014, and a previous history of gestational diabetes in 2000.  At last visit HbA1c was 6.9% and she had another HbA1c since then,  obtained in 03/2020, which was higher, at 7.0%. -She is not checking sugars but states, the last checks were a month ago.  Per review of her meter recordings, her sugars are higher in the morning and they improve later in the day. -At this visit, I suggested to continue Metformin and will also add a GLP-1 receptor agonist to help not only with her diabetes but also with her weight and also fatty liver.  Discussed about benefits and possible side effects.  We will start at a low dose and increase as tolerated. - I suggested to:  Patient Instructions  Please continue: - Metformin 1000 mg 2x day  Please start Ozempic 0.25 mg weekly in a.m. (for example on Sunday morning) x 4 weeks, then increase to 0.5 mg weekly in a.m. if no nausea or hypoglycemia.  Start checking sugars every day, rotating check times.  Please return in 4 months with your sugar log.   - we checked her HbA1c: 6.7% (improved) - advised to check sugars at different times of the day - 1x a day, rotating check times - advised for yearly eye exams >> she is not UTD - return to clinic in 4 months  2. Obesity class I -Previously on the Optifast diet -She was also working out at [employer] with a Physiological scientist -Her diet was initially better at the beginning of the pandemic as she was not eating out but were sent after she returned to work. -She is trying to find a diet that would work for her.  At last visit we discussed about Noom,  weight watchers diet, and intermittent fasting. -At this visit, she lost a net 4 pounds since last visit. -We discussed about adding the GLP-1 receptor agonist which should also help with weight loss  3. HL -Reviewed latest lipid panel from 03/2020: LDL above goal of 1 to rest of the fractions at goal Lab Results  Component Value Date   CHOL 195 04/12/2020   HDL 66.10 04/12/2020   LDLCALC 109 (H) 04/12/2020   TRIG 102.0  04/12/2020   CHOLHDL 3 04/12/2020  -She is not on a statin   Philemon Kingdom, MD PhD [facility]

## 2020-07-03 NOTE — Patient Instructions (Addendum)
Please continue: - Metformin 1000 mg 2x day  Please start Ozempic 0.25 mg weekly in a.m. (for example on Sunday morning) x 4 weeks, then increase to 0.5 mg weekly in a.m. if no nausea or hypoglycemia.  Start checking sugars every day, rotating check times.  Please return in 4 months with your sugar log.

## 2020-07-18 ENCOUNTER — Encounter: Payer: Self-pay | Admitting: Obstetrics and Gynecology

## 2020-07-18 DIAGNOSIS — Z1231 Encounter for screening mammogram for malignant neoplasm of breast: Secondary | ICD-10-CM | POA: Diagnosis not present

## 2020-07-30 DIAGNOSIS — K760 Fatty (change of) liver, not elsewhere classified: Secondary | ICD-10-CM | POA: Diagnosis not present

## 2020-07-30 DIAGNOSIS — R748 Abnormal levels of other serum enzymes: Secondary | ICD-10-CM | POA: Diagnosis not present

## 2020-07-30 DIAGNOSIS — E669 Obesity, unspecified: Secondary | ICD-10-CM | POA: Diagnosis not present

## 2020-07-30 LAB — HEPATIC FUNCTION PANEL
ALT: 44 — AB (ref 7–35)
AST: 26 (ref 13–35)

## 2020-07-30 LAB — COMPREHENSIVE METABOLIC PANEL: Albumin: 4.3 (ref 3.5–5.0)

## 2020-10-28 DIAGNOSIS — H02883 Meibomian gland dysfunction of right eye, unspecified eyelid: Secondary | ICD-10-CM | POA: Diagnosis not present

## 2020-10-28 DIAGNOSIS — H04123 Dry eye syndrome of bilateral lacrimal glands: Secondary | ICD-10-CM | POA: Diagnosis not present

## 2020-10-28 DIAGNOSIS — H02886 Meibomian gland dysfunction of left eye, unspecified eyelid: Secondary | ICD-10-CM | POA: Diagnosis not present

## 2020-10-28 DIAGNOSIS — E119 Type 2 diabetes mellitus without complications: Secondary | ICD-10-CM | POA: Diagnosis not present

## 2020-10-28 LAB — HM DIABETES EYE EXAM

## 2020-10-29 ENCOUNTER — Encounter: Payer: Self-pay | Admitting: Family Medicine

## 2020-10-29 NOTE — Progress Notes (Signed)
0801 

## 2021-04-03 ENCOUNTER — Other Ambulatory Visit: Payer: Self-pay | Admitting: Internal Medicine

## 2021-04-10 ENCOUNTER — Ambulatory Visit: Payer: BC Managed Care – PPO | Admitting: Obstetrics and Gynecology

## 2021-04-28 ENCOUNTER — Telehealth: Payer: Self-pay | Admitting: Family Medicine

## 2021-04-28 ENCOUNTER — Encounter: Payer: Self-pay | Admitting: Family Medicine

## 2021-04-28 DIAGNOSIS — E538 Deficiency of other specified B group vitamins: Secondary | ICD-10-CM

## 2021-04-28 DIAGNOSIS — E782 Mixed hyperlipidemia: Secondary | ICD-10-CM

## 2021-04-28 DIAGNOSIS — E1165 Type 2 diabetes mellitus with hyperglycemia: Secondary | ICD-10-CM

## 2021-04-28 DIAGNOSIS — E559 Vitamin D deficiency, unspecified: Secondary | ICD-10-CM

## 2021-04-28 DIAGNOSIS — K760 Fatty (change of) liver, not elsewhere classified: Secondary | ICD-10-CM

## 2021-04-28 NOTE — Telephone Encounter (Signed)
Pt has a cpe sch for 05-26-2021 and would like to have cpe labs in advance

## 2021-04-29 DIAGNOSIS — E119 Type 2 diabetes mellitus without complications: Secondary | ICD-10-CM | POA: Insufficient documentation

## 2021-05-06 ENCOUNTER — Encounter: Payer: Self-pay | Admitting: Family Medicine

## 2021-05-19 ENCOUNTER — Other Ambulatory Visit (INDEPENDENT_AMBULATORY_CARE_PROVIDER_SITE_OTHER): Payer: BC Managed Care – PPO

## 2021-05-19 DIAGNOSIS — E1165 Type 2 diabetes mellitus with hyperglycemia: Secondary | ICD-10-CM | POA: Diagnosis not present

## 2021-05-19 DIAGNOSIS — E559 Vitamin D deficiency, unspecified: Secondary | ICD-10-CM | POA: Diagnosis not present

## 2021-05-19 DIAGNOSIS — E782 Mixed hyperlipidemia: Secondary | ICD-10-CM | POA: Diagnosis not present

## 2021-05-19 DIAGNOSIS — K760 Fatty (change of) liver, not elsewhere classified: Secondary | ICD-10-CM

## 2021-05-19 DIAGNOSIS — E538 Deficiency of other specified B group vitamins: Secondary | ICD-10-CM

## 2021-05-19 LAB — COMPREHENSIVE METABOLIC PANEL
ALT: 33 U/L (ref 0–35)
AST: 25 U/L (ref 0–37)
Albumin: 4.4 g/dL (ref 3.5–5.2)
Alkaline Phosphatase: 82 U/L (ref 39–117)
BUN: 16 mg/dL (ref 6–23)
CO2: 30 mEq/L (ref 19–32)
Calcium: 9.4 mg/dL (ref 8.4–10.5)
Chloride: 104 mEq/L (ref 96–112)
Creatinine, Ser: 0.76 mg/dL (ref 0.40–1.20)
GFR: 89.19 mL/min (ref >60.00)
Glucose, Bld: 141 mg/dL — ABNORMAL HIGH (ref 70–99)
Potassium: 4.4 mEq/L (ref 3.5–5.1)
Sodium: 138 mEq/L (ref 135–145)
Total Bilirubin: 0.5 mg/dL (ref 0.2–1.2)
Total Protein: 7.5 g/dL (ref 6.0–8.3)

## 2021-05-19 LAB — LIPID PANEL
Cholesterol: 188 mg/dL (ref 0–200)
HDL: 61.8 mg/dL (ref >39.00)
LDL Cholesterol: 109 mg/dL — ABNORMAL HIGH (ref 0–99)
NonHDL: 126.6
Total CHOL/HDL Ratio: 3
Triglycerides: 88 mg/dL (ref 0.0–149.0)
VLDL: 17.6 mg/dL (ref 0.0–40.0)

## 2021-05-19 LAB — CBC WITH DIFFERENTIAL/PLATELET
Basophils Absolute: 0 10*3/uL (ref 0.0–0.1)
Basophils Relative: 0.6 % (ref 0.0–3.0)
Eosinophils Absolute: 0.2 10*3/uL (ref 0.0–0.7)
Eosinophils Relative: 4 % (ref 0.0–5.0)
HCT: 41.6 % (ref 36.0–46.0)
Hemoglobin: 14 g/dL (ref 12.0–15.0)
Lymphocytes Relative: 24 % (ref 12.0–46.0)
Lymphs Abs: 1.4 10*3/uL (ref 0.7–4.0)
MCHC: 33.5 g/dL (ref 30.0–36.0)
MCV: 88.1 fl (ref 78.0–100.0)
Monocytes Absolute: 0.5 10*3/uL (ref 0.1–1.0)
Monocytes Relative: 8.2 % (ref 3.0–12.0)
Neutro Abs: 3.6 10*3/uL (ref 1.4–7.7)
Neutrophils Relative %: 63.2 % (ref 43.0–77.0)
Platelets: 387 10*3/uL (ref 150.0–400.0)
RBC: 4.73 Mil/uL (ref 3.87–5.11)
RDW: 13.3 % (ref 11.5–15.5)
WBC: 5.7 10*3/uL (ref 4.0–10.5)

## 2021-05-19 LAB — TSH: TSH: 3.85 u[IU]/mL (ref 0.35–5.50)

## 2021-05-19 LAB — MICROALBUMIN / CREATININE URINE RATIO
Creatinine,U: 55.4 mg/dL
Microalb Creat Ratio: 2.6 mg/g (ref 0.0–30.0)
Microalb, Ur: 1.4 mg/dL (ref 0.0–1.9)

## 2021-05-19 LAB — VITAMIN D 25 HYDROXY (VIT D DEFICIENCY, FRACTURES): VITD: 27.78 ng/mL — ABNORMAL LOW (ref 30.00–100.00)

## 2021-05-19 LAB — HEMOGLOBIN A1C: Hgb A1c MFr Bld: 6.9 % — ABNORMAL HIGH (ref 4.6–6.5)

## 2021-05-19 LAB — FOLATE: Folate: 11.2 ng/mL (ref >5.9)

## 2021-05-19 LAB — VITAMIN B12: Vitamin B-12: 319 pg/mL (ref 211–911)

## 2021-05-26 ENCOUNTER — Ambulatory Visit (INDEPENDENT_AMBULATORY_CARE_PROVIDER_SITE_OTHER): Payer: BC Managed Care – PPO | Admitting: Family Medicine

## 2021-05-26 ENCOUNTER — Encounter: Payer: Self-pay | Admitting: Family Medicine

## 2021-05-26 VITALS — BP 112/82 | HR 81 | Temp 97.9°F | Ht 67.75 in | Wt 217.5 lb

## 2021-05-26 DIAGNOSIS — Z0000 Encounter for general adult medical examination without abnormal findings: Secondary | ICD-10-CM

## 2021-05-26 DIAGNOSIS — E1165 Type 2 diabetes mellitus with hyperglycemia: Secondary | ICD-10-CM | POA: Diagnosis not present

## 2021-05-26 DIAGNOSIS — Z6833 Body mass index (BMI) 33.0-33.9, adult: Secondary | ICD-10-CM

## 2021-05-26 DIAGNOSIS — E782 Mixed hyperlipidemia: Secondary | ICD-10-CM | POA: Diagnosis not present

## 2021-05-26 DIAGNOSIS — E6609 Other obesity due to excess calories: Secondary | ICD-10-CM

## 2021-05-26 DIAGNOSIS — E559 Vitamin D deficiency, unspecified: Secondary | ICD-10-CM

## 2021-05-26 DIAGNOSIS — F418 Other specified anxiety disorders: Secondary | ICD-10-CM

## 2021-05-26 MED ORDER — SEMAGLUTIDE-WEIGHT MANAGEMENT 0.25 MG/0.5ML ~~LOC~~ SOAJ: 0.2500 mg | SUBCUTANEOUS | 0 refills | Status: DC

## 2021-05-26 NOTE — Progress Notes (Signed)
Cynthia Walters DOB: 1965/05/25 Encounter date: 05/26/2021  This is a 56 y.o. female who presents for complete physical   History of present illness/Additional concerns: Last visit with me was on/14/22.  Follows with Dr.Gherge for diabetes -she was started on Ozempic in April.  It was suggested to continue metformin twice daily. She never started it - just worried about digestive issues with this. Cost was going to be a lot for her. She has high deductible plan. She hasn't followed up with Dr. Renne Crigler since then. She doesn't have big appetite as it is. She does have hard time with healthy eating sometimes. Tries to take 2 of the metformin daily. But if doesn't have a meal, then will just take 1.   At last visit we ordered a sleep study to evaluate for sleep apnea.  I do not see that this was completed.  It appears that patient was contacted but did not return phone calls to schedule. When she looked at insurance, she states that price looked higher with facility that was calling her rather than the one that we referred to.   Follows with gyn. Was seeing Dr. Sabra Heck. Mammogram completed 06/2020. Normal.  Knows she needs to get shingles vaccine; doesn't want today.   Last colonoscopy was 2019. Report stated repeat in 5-10 years.   Vitamin D was a little low. Not taking supplement due to fatty liver.   A1C stable on 05/19/21 at 6.9  Stopped taking prozac - just wondered if it had affected liver. Mood has been stable - just still feels depressed. Low energy, lack of motivation.   Past Medical History:  Diagnosis Date   Anxiety    Depression    Depression with anxiety 01/08/2014   Hematuria    Hyperglycemia 03/19/2015   Hyperlipidemia, mixed 11/29/2015   Obesity 11/29/2015   Rosacea    Vitamin B12 deficiency    Vitamin D deficiency    Past Surgical History:  Procedure Laterality Date   CESAREAN SECTION  2000   DENTAL SURGERY  01/2011   tooth removed,implant   WISDOM TOOTH EXTRACTION   age 47   No Known Allergies Current Meds  Medication Sig   glucose blood test strip Use as instructed once a day - for [employer] IQ meter   Lancets (ONETOUCH DELICA PLUS [id number]) MISC 1 each by Does not apply route daily. Use to check blood sugar once a day.   metFORMIN (GLUCOPHAGE) 1000 MG tablet TAKE ONE TABLET BY MOUTH TWICE DAILY WITH meals   Semaglutide-Weight Management 0.25 MG/0.5ML SOAJ Inject 0.25 mg into the skin once a week for 28 days. [id number]; exp 01/27/22   Social History   Tobacco Use   Smoking status: Never   Smokeless tobacco: Never  Substance Use Topics   Alcohol use: Yes    Alcohol/week: 5.0 standard drinks    Types: 5 Glasses of wine per week    Comment: occassional wine with dinner   Family History  Problem Relation Age of Onset   Thyroid disease Mother    Dementia Mother        alzheimers   Bipolar disorder Father    Obesity Father    Hearing loss Father        does run in family   Dementia Father        per patient; but not formally tested   Depression Brother    Depression Brother    Hypertension Maternal Grandmother    Heart attack Maternal Grandmother  104   ALS Maternal Grandfather    Stroke Paternal Grandmother 30   Diabetes Paternal Grandmother    Stroke Paternal Grandfather 59     Review of Systems  Constitutional:  Negative for chills, fatigue and fever.  Respiratory:  Negative for cough, chest tightness, shortness of breath and wheezing.   Cardiovascular:  Negative for chest pain, palpitations and leg swelling.   CBC:  Lab Results  Component Value Date   WBC 5.7 05/19/2021   HGB 14.0 05/19/2021   HCT 41.6 05/19/2021   MCH 29.4 01/08/2014   MCHC 33.5 05/19/2021   RDW 13.3 05/19/2021   PLT 387.0 05/19/2021   CMP: Lab Results  Component Value Date   NA 138 05/19/2021   K 4.4 05/19/2021   CL 104 05/19/2021   CO2 30 05/19/2021   GLUCOSE 141 (H) 05/19/2021   BUN 16 05/19/2021   CREATININE 0.76 05/19/2021   CREATININE  0.72 08/07/2015   GFRAA >89 08/07/2015   CALCIUM 9.4 05/19/2021   PROT 7.5 05/19/2021   BILITOT 0.5 05/19/2021   ALKPHOS 82 05/19/2021   ALT 33 05/19/2021   AST 25 05/19/2021   LIPID: Lab Results  Component Value Date   CHOL 188 05/19/2021   TRIG 88.0 05/19/2021   HDL 61.80 05/19/2021   LDLCALC 109 (H) 05/19/2021    Objective:  BP 112/82 (BP Location: Left Arm, Patient Position: Sitting, Cuff Size: Large)    Pulse 81    Temp 97.9 F (36.6 C) (Oral)    Ht 5' 7.75" (1.721 m)    Wt 217 lb 8 oz (98.7 kg)    LMP 10/31/2018 (Within Weeks)    SpO2 97%    BMI 33.32 kg/m   Weight: 217 lb 8 oz (98.7 kg)   BP Readings from Last 3 Encounters:  05/26/21 112/82  07/03/20 120/82  04/12/20 102/80   Wt Readings from Last 3 Encounters:  05/26/21 217 lb 8 oz (98.7 kg)  07/03/20 215 lb 9.6 oz (97.8 kg)  04/12/20 217 lb 3.2 oz (98.5 kg)    Physical Exam Constitutional:      General: She is not in acute distress.    Appearance: She is well-developed.  HENT:     Head: Normocephalic and atraumatic.     Right Ear: External ear normal.     Left Ear: External ear normal.     Mouth/Throat:     Pharynx: No oropharyngeal exudate.  Eyes:     Conjunctiva/sclera: Conjunctivae normal.     Pupils: Pupils are equal, round, and reactive to light.  Neck:     Thyroid: No thyromegaly.  Cardiovascular:     Rate and Rhythm: Normal rate and regular rhythm.     Heart sounds: Normal heart sounds. No murmur heard.   No friction rub. No gallop.  Pulmonary:     Effort: Pulmonary effort is normal.     Breath sounds: Normal breath sounds.  Abdominal:     General: Bowel sounds are normal. There is no distension.     Palpations: Abdomen is soft. There is no mass.     Tenderness: There is no abdominal tenderness. There is no guarding.     Hernia: No hernia is present.  Musculoskeletal:        General: No tenderness or deformity. Normal range of motion.     Cervical back: Normal range of motion and neck  supple.  Feet:     Comments: Normal monofilament exam bilat Lymphadenopathy:  Cervical: No cervical adenopathy.  Skin:    General: Skin is warm and dry.     Findings: No rash.  Neurological:     Mental Status: She is alert and oriented to person, place, and time.     Deep Tendon Reflexes: Reflexes normal.     Reflex Scores:      Tricep reflexes are 2+ on the right side and 2+ on the left side.      Bicep reflexes are 2+ on the right side and 2+ on the left side.      Brachioradialis reflexes are 2+ on the right side and 2+ on the left side.      Patellar reflexes are 2+ on the right side and 2+ on the left side. Psychiatric:        Speech: Speech normal.        Behavior: Behavior normal.        Thought Content: Thought content normal.    Assessment/Plan: Health Maintenance Due  Topic Date Due   FOOT EXAM  Never done   Zoster Vaccines- Shingrix (1 of 2) Never done   Health Maintenance reviewed. Will sign for path release from colonoscopy.  1. Preventative health care She is in a cycle without exercise/healthy lifestyle behaviors that she knows is contributing to lack of energy, stagnant weight, and mood.  We are going to tackle weight loss/sugar control first and see if getting some results from this and will help motivation.  See below.  2. Class 1 obesity due to excess calories with serious comorbidity and body mass index (BMI) of 33.0 to 33.9 in adult Of note, sleep study was previously ordered, but she did not follow with flow through mostly due to cost.  She has a list of insurance and she will let me know better choice for her to complete this study with.  I encouraged her to look into versus facility sleep studies.  We also discussed that sleep apnea should improve present weight loss.  See how she does the GLP as needed for sleep study after weight check in.  3. Type 2 diabetes mellitus with hyperglycemia, without long-term current use of insulin ([place]) Since I had a  sample of Wegovy in the office, this is what I gave her to try and get an idea of how she responded to this category of medication.  We discussed that [place] may be better covered through insurance since it is indicated to treat diabetes.  She will update me in 2 weeks to let me know how she does with the [facility].Discussed new medication(s) today with patient. Discussed potential side effects and patient verbalized understanding.  - [facility] DIABETES FOOT EXAM  4. Hyperlipidemia, mixed We discussed starting a statin for preventative purposes.  She is willing to consider this and we discussed low-dose Crestor, but we are going to wait to see how she does with the [facility] start first before adding this.  5. Vitamin D deficiency Suggested low-dose 1 to 2000 units daily.  6. Depression with anxiety Mood has not changed much since she stopped her Prozac.  We will continue to assess.  We are going to work on weight loss.  We did discuss working towards positive changes including exercise and healthy eating which I think will help with mood overall.  She will update me through mychart in 2 weeks time so we can order GLP-1/figure out cost coverage.  [name], MD

## 2021-05-26 NOTE — Patient Instructions (Signed)
Look at coverage for sleep - I can refer you to someone who appears to be less expensive.

## 2021-05-28 DIAGNOSIS — Z87442 Personal history of urinary calculi: Secondary | ICD-10-CM

## 2021-05-28 HISTORY — DX: Personal history of urinary calculi: Z87.442

## 2021-06-10 ENCOUNTER — Emergency Department (HOSPITAL_BASED_OUTPATIENT_CLINIC_OR_DEPARTMENT_OTHER): Payer: BC Managed Care – PPO

## 2021-06-10 ENCOUNTER — Other Ambulatory Visit: Payer: Self-pay

## 2021-06-10 ENCOUNTER — Emergency Department (HOSPITAL_BASED_OUTPATIENT_CLINIC_OR_DEPARTMENT_OTHER)
Admission: EM | Admit: 2021-06-10 | Discharge: 2021-06-10 | Disposition: A | Discharge status: Home / Self Care | Payer: BC Managed Care – PPO | Attending: Emergency Medicine | Admitting: Emergency Medicine

## 2021-06-10 ENCOUNTER — Encounter (HOSPITAL_BASED_OUTPATIENT_CLINIC_OR_DEPARTMENT_OTHER): Payer: Self-pay | Admitting: Emergency Medicine

## 2021-06-10 ENCOUNTER — Other Ambulatory Visit (HOSPITAL_BASED_OUTPATIENT_CLINIC_OR_DEPARTMENT_OTHER): Payer: Self-pay

## 2021-06-10 DIAGNOSIS — E119 Type 2 diabetes mellitus without complications: Secondary | ICD-10-CM | POA: Diagnosis not present

## 2021-06-10 DIAGNOSIS — N200 Calculus of kidney: Secondary | ICD-10-CM | POA: Insufficient documentation

## 2021-06-10 DIAGNOSIS — N201 Calculus of ureter: Secondary | ICD-10-CM | POA: Diagnosis not present

## 2021-06-10 DIAGNOSIS — K760 Fatty (change of) liver, not elsewhere classified: Secondary | ICD-10-CM | POA: Diagnosis not present

## 2021-06-10 DIAGNOSIS — R1031 Right lower quadrant pain: Secondary | ICD-10-CM | POA: Diagnosis not present

## 2021-06-10 DIAGNOSIS — R1111 Vomiting without nausea: Secondary | ICD-10-CM | POA: Diagnosis not present

## 2021-06-10 HISTORY — DX: Type 2 diabetes mellitus without complications: E11.9

## 2021-06-10 LAB — COMPREHENSIVE METABOLIC PANEL
ALT: 33 U/L (ref 0–44)
AST: 24 U/L (ref 15–41)
Albumin: 4.2 g/dL (ref 3.5–5.0)
Alkaline Phosphatase: 82 U/L (ref 38–126)
Anion gap: 9 (ref 5–15)
BUN: 18 mg/dL (ref 6–20)
CO2: 24 mmol/L (ref 22–32)
Calcium: 9.4 mg/dL (ref 8.9–10.3)
Chloride: 103 mmol/L (ref 98–111)
Creatinine, Ser: 0.66 mg/dL (ref 0.44–1.00)
GFR, Estimated: >60 mL/min (ref >60)
Glucose, Bld: 175 mg/dL — ABNORMAL HIGH (ref 70–99)
Potassium: 4.3 mmol/L (ref 3.5–5.1)
Sodium: 136 mmol/L (ref 135–145)
Total Bilirubin: 1.1 mg/dL (ref 0.3–1.2)
Total Protein: 7.8 g/dL (ref 6.5–8.1)

## 2021-06-10 LAB — URINALYSIS, ROUTINE W REFLEX MICROSCOPIC
Bilirubin Urine: NEGATIVE
Glucose, UA: NEGATIVE mg/dL
Ketones, ur: 80 mg/dL — AB
Nitrite: NEGATIVE
Protein, ur: 30 mg/dL — AB
Specific Gravity, Urine: >=1.03 (ref 1.005–1.030)
pH: 6 (ref 5.0–8.0)

## 2021-06-10 LAB — URINALYSIS, MICROSCOPIC (REFLEX)

## 2021-06-10 LAB — CBC
HCT: 42.7 % (ref 36.0–46.0)
Hemoglobin: 14.4 g/dL (ref 12.0–15.0)
MCH: 29.3 pg (ref 26.0–34.0)
MCHC: 33.7 g/dL (ref 30.0–36.0)
MCV: 86.8 fL (ref 80.0–100.0)
Platelets: 410 10*3/uL — ABNORMAL HIGH (ref 150–400)
RBC: 4.92 MIL/uL (ref 3.87–5.11)
RDW: 12.6 % (ref 11.5–15.5)
WBC: 9.7 10*3/uL (ref 4.0–10.5)
nRBC: 0 % (ref 0.0–0.2)

## 2021-06-10 LAB — LIPASE, BLOOD: Lipase: 32 U/L (ref 11–51)

## 2021-06-10 MED ORDER — IBUPROFEN 600 MG PO TABS
600.0000 mg | ORAL_TABLET | Freq: Four times a day (QID) | ORAL | 0 refills | Status: DC | PRN
Start: 2021-06-10 — End: 2021-10-09
  Filled 2021-06-10: qty 30, 8d supply, fill #0

## 2021-06-10 MED ORDER — ONDANSETRON 4 MG PO TBDP
4.0000 mg | ORAL_TABLET | Freq: Once | ORAL | Status: AC | PRN
  Administered 2021-06-10: 4 mg via ORAL
  Filled 2021-06-10: qty 1

## 2021-06-10 MED ORDER — OXYCODONE-ACETAMINOPHEN 5-325 MG PO TABS
1.0000 | ORAL_TABLET | Freq: Four times a day (QID) | ORAL | 0 refills | Status: DC | PRN
  Filled 2021-06-10: qty 15, 4d supply, fill #0

## 2021-06-10 MED ORDER — ONDANSETRON 4 MG PO TBDP
4.0000 mg | ORAL_TABLET | Freq: Three times a day (TID) | ORAL | 0 refills | Status: DC | PRN
  Filled 2021-06-10: qty 20, 7d supply, fill #0

## 2021-06-10 MED ORDER — TAMSULOSIN HCL 0.4 MG PO CAPS
0.4000 mg | ORAL_CAPSULE | Freq: Every day | ORAL | 0 refills | Status: DC
  Filled 2021-06-10: qty 14, 14d supply, fill #0

## 2021-06-10 MED ORDER — SODIUM CHLORIDE 0.9 % IV BOLUS
1000.0000 mL | Freq: Once | INTRAVENOUS | Status: AC
  Administered 2021-06-10: 1000 mL via INTRAVENOUS

## 2021-06-10 MED ORDER — KETOROLAC TROMETHAMINE 30 MG/ML IJ SOLN
30.0000 mg | Freq: Once | INTRAMUSCULAR | Status: AC
  Administered 2021-06-10: 30 mg via INTRAVENOUS
  Filled 2021-06-10: qty 1

## 2021-06-10 NOTE — ED Provider Notes (Signed)
?[facility] DEPART[facility] ?Provider Note ? ? ?CSN: [id number] ?Arrival date & time: 06/10/21  1137 ? ?  ? ?History ? ?Chief Complaint  ?Patient presents with  ? Abdominal Pain  ? ? ?Shacoya Burkhammer is a 56 y.o. female. ? ?Pt is a 56 yo female with a hx of anxiety, depression, obesity, dm, and hyperlipidemia.  She woke up this am with right sided flank pain.  Pain is now moving to her groin.  She has had n/v as well.  No f/c.  She has never had a kidney stone. ? ? ?  ? ?Home Medications ?Prior to Admission medications   ?Medication Sig Start Date End Date Taking? Authorizing Provider  ?ibuprofen (ADVIL) 600 MG tablet Take 1 tablet (600 mg total) by mouth every 6 (six) hours as needed. 06/10/21  Yes Isla Pence, MD  ?ondansetron (ZOFRAN-ODT) 4 MG disintegrating tablet Take 1 tablet (4 mg total) by mouth every 8 (eight) hours as needed for nausea or vomiting. 06/10/21  Yes Isla Pence, MD  ?oxyCODONE-acetaminophen (PERCOCET/ROXICET) 5-325 MG tablet Take 1 tablet by mouth every 6 (six) hours as needed for severe pain. 06/10/21  Yes Isla Pence, MD  ?tamsulosin (FLOMAX) 0.4 MG CAPS capsule Take 1 capsule (0.4 mg total) by mouth daily. 06/10/21  Yes Isla Pence, MD  ?glucose blood test strip Use as instructed once a day - for [employer] IQ meter 12/19/19   Philemon Kingdom, MD  ?Lancets (ONETOUCH DELICA PLUS [id number]) [place] 1 each by Does not apply route daily. Use to check blood sugar once a day. 06/09/18   Philemon Kingdom, MD  ?metFORMIN (GLUCOPHAGE) 1000 MG tablet TAKE ONE TABLET BY MOUTH TWICE DAILY WITH meals 04/03/21   Philemon Kingdom, MD  ?Semaglutide-Weight Management 0.25 MG/0.5ML SOAJ Inject 0.25 mg into the skin once a week for 28 days. [id number]; exp 01/27/22 05/26/21 06/23/21  Caren Macadam, MD  ?   ? ?Allergies    ?Patient has no known allergies.   ? ?Review of Systems   ?Review of Systems  ?Gastrointestinal:  Positive for abdominal pain, nausea and vomiting.  ?All  other systems reviewed and are negative. ? ?Physical Exam ?Updated Vital Signs ?BP 119/71 (BP Location: Left Arm)   Pulse 89   Temp 98.3 ?F (36.8 ?C) (Oral)   Resp 16   Ht '5\' 7"'$  (1.702 m)   Wt 93.9 kg   LMP 10/31/2018 (Within Weeks)   SpO2 97%   BMI 32.42 kg/m?  ?Physical Exam ?Vitals and nursing note reviewed.  ?Constitutional:   ?   Appearance: She is well-developed.  ?HENT:  ?   Head: Normocephalic and atraumatic.  ?   Mouth/Throat:  ?   Mouth: Mucous membranes are moist.  ?   Pharynx: Oropharynx is clear.  ?Eyes:  ?   Extraocular Movements: Extraocular movements intact.  ?   Pupils: Pupils are equal, round, and reactive to light.  ?Cardiovascular:  ?   Rate and Rhythm: Normal rate and regular rhythm.  ?Pulmonary:  ?   Effort: Pulmonary effort is normal.  ?   Breath sounds: Normal breath sounds.  ?Abdominal:  ?   General: Abdomen is flat. Bowel sounds are normal.  ?   Palpations: Abdomen is soft.  ?   Tenderness: There is abdominal tenderness in the right lower quadrant.  ?Skin: ?   General: Skin is warm.  ?   Capillary Refill: Capillary refill takes less than 2 seconds.  ?Neurological:  ?   General:  No focal deficit present.  ?   Mental Status: She is alert and oriented to person, place, and time.  ?Psychiatric:     ?   Mood and Affect: Mood normal.     ?   Behavior: Behavior normal.  ? ? ?ED Results / Procedures / Treatments   ?Labs ?(all labs ordered are listed, but only abnormal results are displayed) ?Labs Reviewed  ?COMPREHENSIVE METABOLIC PANEL - Abnormal; Notable for the following components:  ?    Result Value  ? Glucose, Bld 175 (*)   ? All other components within normal limits  ?CBC - Abnormal; Notable for the following components:  ? Platelets 410 (*)   ? All other components within normal limits  ?URINALYSIS, ROUTINE W REFLEX MICROSCOPIC - Abnormal; Notable for the following components:  ? APPearance HAZY (*)   ? Hgb urine dipstick LARGE (*)   ? Ketones, ur 80 (*)   ? Protein, ur 30 (*)   ?  Leukocytes,Ua TRACE (*)   ? All other components within normal limits  ?URINALYSIS, MICROSCOPIC (REFLEX) - Abnormal; Notable for the following components:  ? Bacteria, UA RARE (*)   ? All other components within normal limits  ?LIPASE, BLOOD  ? ? ?EKG ?None ? ?Radiology ?No results found. ? ?Procedures ?Procedures  ? ? ?Medications Ordered in ED ?Medications  ?ondansetron (ZOFRAN-ODT) disintegrating tablet 4 mg (4 mg Oral Given 06/10/21 1228)  ?ketorolac (TORADOL) 30 MG/ML injection 30 mg (30 mg Intravenous Given 06/10/21 1414)  ?sodium chloride 0.9 % bolus 1,000 mL (0 mLs Intravenous Stopped 06/10/21 1551)  ? ? ?ED Course/ Medical Decision Making/ A&P ?  ?                        ?Medical Decision Making ?Amount and/or Complexity of Data Reviewed ?Labs: ordered. ?Radiology: ordered. ? ?Risk ?Prescription drug management. ? ? ?This patient presents to the ED for concern of abd pain, this involves an extensive number of treatment options, and is a complaint that carries with it a high risk of complications and morbidity.  The differential diagnosis includes kidney stone, infection, ovarian issue ? ? ?Co morbidities that complicate the patient evaluation ? ?anxiety, depression, obesity, dm, and hyperlipidemia ? ? ?Additional history obtained: ? ?Additional history obtained from epic chart review ?External records from outside source obtained and reviewed including husband ? ? ?Lab Tests: ? ?I Ordered, and personally interpreted labs.  The pertinent results include:  cbc is nl, cmp nl other than bs 175.  Ua  ? ? ?Imaging Studies ordered: ? ?I ordered imaging studies including CT renal  ?I independently visualized and interpreted imaging which showed  ?IMPRESSION: ?1. 3 mm stone at the right ureterovesicular junction with minimal ?upstream collecting/ureter prominence and periureteric stranding. ?2. Additional punctate right renal stone. ?3. Hepatic steatosis.  ?I agree with the radiologist interpretation ? ? ?Cardiac  Monitoring: ? ?The patient was maintained on a cardiac monitor.  I personally viewed and interpreted the cardiac monitored which showed an underlying rhythm of: nsr ? ? ?Medicines ordered and prescription drug management: ? ?I ordered medication including IVFs, zofran, and toradol  for nausea and fluids  ?Reevaluation of the patient after these medicines showed that the patient improved ?I have reviewed the patients home medicines and have made adjustments as needed ? ? ?Test Considered: ? ?Ct renal ? ? ?Critical Interventions: ? ?Pain meds ? ? ? ?Problem List / ED Course: ? ?Ureteral stone:  Pt is feeling much better after IVFs and meds. ? ? ?Reevaluation: ? ?After the interventions noted above, I reevaluated the patient and found that they have :improved ? ? ?Social Determinants of Health: ? ?Lives at home with husband ? ? ?Dispostion: ? ?After consideration of the diagnostic results and the patients response to treatment, I feel that the patent would benefit from discharge with outpatient f/u.   ? ? ? ? ? ? ? ?Final Clinical Impression(s) / ED Diagnoses ?Final diagnoses:  ?Kidney stone  ? ? ?Rx / DC Orders ?ED Discharge Orders   ? ?      Ordered  ?  oxyCODONE-acetaminophen (PERCOCET/ROXICET) 5-325 MG tablet  Every 6 hours PRN       ? 06/10/21 1543  ?  ondansetron (ZOFRAN-ODT) 4 MG disintegrating tablet  Every 8 hours PRN       ? 06/10/21 1543  ?  tamsulosin (FLOMAX) 0.4 MG CAPS capsule  Daily       ? 06/10/21 1543  ?  ibuprofen (ADVIL) 600 MG tablet  Every 6 hours PRN       ? 06/10/21 1543  ? ?  ?  ? ?  ? ? ?  ?Isla Pence, MD ?06/10/21 1552 ? ?

## 2021-06-10 NOTE — ED Triage Notes (Signed)
Pt arrives pov, steady gait to triage, with c/o RLQ pain and n/v starting today.  ?

## 2021-06-11 ENCOUNTER — Encounter: Payer: Self-pay | Admitting: Family Medicine

## 2021-06-11 DIAGNOSIS — E1165 Type 2 diabetes mellitus with hyperglycemia: Secondary | ICD-10-CM

## 2021-06-11 DIAGNOSIS — E6609 Other obesity due to excess calories: Secondary | ICD-10-CM

## 2021-06-17 ENCOUNTER — Other Ambulatory Visit (HOSPITAL_BASED_OUTPATIENT_CLINIC_OR_DEPARTMENT_OTHER): Payer: Self-pay

## 2021-06-17 MED ORDER — OZEMPIC (0.25 OR 0.5 MG/DOSE) 2 MG/3ML ~~LOC~~ SOPN
0.5000 mg | PEN_INJECTOR | SUBCUTANEOUS | 2 refills | Status: DC
  Filled 2021-06-17: qty 1.5, 28d supply, fill #0
  Filled 2021-08-04: qty 3, 28d supply, fill #0

## 2021-06-18 ENCOUNTER — Other Ambulatory Visit (HOSPITAL_BASED_OUTPATIENT_CLINIC_OR_DEPARTMENT_OTHER): Payer: Self-pay

## 2021-06-19 ENCOUNTER — Telehealth: Payer: Self-pay | Admitting: *Deleted

## 2021-06-19 ENCOUNTER — Other Ambulatory Visit (HOSPITAL_BASED_OUTPATIENT_CLINIC_OR_DEPARTMENT_OTHER): Payer: Self-pay

## 2021-06-19 NOTE — Telephone Encounter (Signed)
Prior auth for Ozempic 0.'25mg'$  was sent to [email or web address]-Key: [id number] pending review by the patient's insurance. ?

## 2021-06-20 ENCOUNTER — Other Ambulatory Visit (HOSPITAL_BASED_OUTPATIENT_CLINIC_OR_DEPARTMENT_OTHER): Payer: Self-pay

## 2021-06-23 ENCOUNTER — Other Ambulatory Visit (HOSPITAL_BASED_OUTPATIENT_CLINIC_OR_DEPARTMENT_OTHER): Payer: Self-pay

## 2021-07-24 NOTE — Telephone Encounter (Signed)
Please see denial letter scanned under "AMB corresp" from 3/23. ?

## 2021-07-31 ENCOUNTER — Encounter: Payer: Self-pay | Admitting: Family Medicine

## 2021-07-31 NOTE — Telephone Encounter (Signed)
Please see prior communication in patient messaging. I see the denial for the prior auth scanned into chart. We need to do an appeal for her. She can request or I thought it would be easier for [place] to request on her behalf. I will type a letter in support of the appeal. ?

## 2021-08-04 ENCOUNTER — Other Ambulatory Visit (HOSPITAL_BASED_OUTPATIENT_CLINIC_OR_DEPARTMENT_OTHER): Payer: Self-pay

## 2021-08-05 ENCOUNTER — Other Ambulatory Visit (HOSPITAL_BASED_OUTPATIENT_CLINIC_OR_DEPARTMENT_OTHER): Payer: Self-pay

## 2021-08-05 MED ORDER — OZEMPIC (0.25 OR 0.5 MG/DOSE) 2 MG/3ML ~~LOC~~ SOPN: 0.5000 mg | PEN_INJECTOR | SUBCUTANEOUS | 2 refills | Status: DC

## 2021-08-05 NOTE — Addendum Note (Signed)
Addended by: Agnes Lawrence on: 08/05/2021 08:22 AM ? ? Modules accepted: Orders ? ?

## 2021-08-28 ENCOUNTER — Telehealth (INDEPENDENT_AMBULATORY_CARE_PROVIDER_SITE_OTHER): Payer: BC Managed Care – PPO | Admitting: Family Medicine

## 2021-08-28 ENCOUNTER — Encounter: Payer: Self-pay | Admitting: Family Medicine

## 2021-08-28 VITALS — Wt 202.0 lb

## 2021-08-28 DIAGNOSIS — Z72820 Sleep deprivation: Secondary | ICD-10-CM | POA: Diagnosis not present

## 2021-08-28 DIAGNOSIS — F339 Major depressive disorder, recurrent, unspecified: Secondary | ICD-10-CM

## 2021-08-28 DIAGNOSIS — F439 Reaction to severe stress, unspecified: Secondary | ICD-10-CM | POA: Diagnosis not present

## 2021-08-28 MED ORDER — TRAZODONE HCL 50 MG PO TABS: 25.0000 mg | ORAL_TABLET | Freq: Every day | ORAL | 1 refills | Status: DC

## 2021-08-28 NOTE — Patient Instructions (Addendum)
FOR IMPROVED SLEEP AND TO RESET YOUR SLEEP SCHEDULE:  '[]'$  Schedule sleep counseling(cognitive behavioral therapy).   [facility] is a good option.   Call for appointment: [phone]  '[]'$  Schedule 1 month follow up with new Primary Care Provider (PCP) or with me if no openings available with new PCP.  '[]'$  Exercise 30 minutes daily. Avoid caffeine and alcohol - particularly in the evenings.  '[]'$  Go to bed and wake up at the same time everyday.  '[]'$  Keep bedroom cool, dark and quiet  '[]'$  Reserve bed for sleep - do not read, watch TV, look at phone or device, etc., in bed.  '[]'$  If you toss and turn for more then 10-15 minutes, get out of bed and list or journal thoughts or do quiet activity (not screen-time, no tv, phone, computer) then go back to bed. Repeat as needed. Try not to worry about when you will eventually fall asleep.  '[]'$  Some people find that a half dose of benadryl, melatonin, tylenol pm or unisom on a few nights per week is helpful initially for a few weeks. Don't combine multiple medications for sleep.  '[]'$  Seek help for any depression or anxiety. See number above to schedule appointment.   -I sent the medication(s) we discussed to your pharmacy: Meds ordered this encounter  Medications   traZODone (DESYREL) 50 MG tablet    Sig: Take 0.5-1 tablets (25-50 mg total) by mouth at bedtime.    Dispense:  30 tablet    Refill:  1     I hope you are feeling better soon!  Seek in person/follow up appointment promptly if your symptoms worsen, new concerns arise or you are not improving with treatment.  It was nice to meet you today. I help [facility] out with telemedicine visits on Tuesdays and Thursdays and am happy to help if you need a virtual follow up visit on those days. Otherwise, if you have any concerns or questions following this visit please schedule a follow up visit with your Primary Care office or seek care at a local urgent care clinic to avoid  delays in care. If you are having severe or life threatening symptoms or thoughts of harm to yourself or others please call 911 and/or go to the nearest emergency room.    I hope you are feeling better soon!         Trazodone Tablets What is this medication? TRAZODONE (TRAZ oh done) treats depression. It increases the amount of serotonin in the brain, a hormone that helps regulate mood. This medicine may be used for other purposes; ask your health care provider or pharmacist if you have questions. COMMON BRAND NAME(S): Desyrel What should I tell my care team before I take this medication? They need to know if you have any of these conditions: Attempted suicide or thinking about it Bipolar disorder Bleeding problems Glaucoma Heart disease, or previous heart attack Irregular heart beat Kidney or liver disease Low levels of sodium in the blood An unusual or allergic reaction to trazodone, other medications, foods, dyes or preservatives Pregnant or trying to get pregnant Breast-feeding How should I use this medication? Take this medication by mouth with a glass of water. Follow the directions on the prescription label. Take this medication shortly after a meal or a light snack. Take your medication at regular intervals. Do not take your medication more often than directed. Do not stop taking this medication suddenly except upon the advice of your care team.  Stopping this medication too quickly may cause serious side effects or your condition may worsen. A special MedGuide will be given to you by the pharmacist with each prescription and refill. Be sure to read this information carefully each time. Talk to your care team regarding the use of this medication in children. Special care may be needed. Overdosage: If you think you have taken too much of this medicine contact a poison control center or emergency room at once. NOTE: This medicine is only for you. Do not share this medicine  with others. What if I miss a dose? If you miss a dose, take it as soon as you can. If it is almost time for your next dose, take only that dose. Do not take double or extra doses. What may interact with this medication? Do not take this medication with any of the following: Certain medications for fungal infections like fluconazole, itraconazole, ketoconazole, posaconazole, voriconazole Cisapride Dronedarone Linezolid MAOIs like Carbex, Eldepryl, Marplan, Nardil, and Parnate Mesoridazine Methylene blue (injected into a vein) Pimozide Saquinavir Thioridazine This medication may also interact with the following: Alcohol Antiviral medications for HIV or AIDS Aspirin and aspirin-like medications Barbiturates like phenobarbital Certain medications for blood pressure, heart disease, irregular heart beat Certain medications for depression, anxiety, or psychotic disturbances Certain medications for migraine headache like almotriptan, eletriptan, frovatriptan, naratriptan, rizatriptan, sumatriptan, zolmitriptan Certain medications for seizures like carbamazepine and phenytoin Certain medications for sleep Certain medications that treat or prevent blood clots like dalteparin, enoxaparin, warfarin Digoxin Fentanyl Lithium NSAIDS, medications for pain and inflammation, like ibuprofen or naproxen Other medications that prolong the QT interval (cause an abnormal heart rhythm) like dofetilide Rasagiline Supplements like St. John's wort, kava kava, valerian Tramadol Tryptophan This list may not describe all possible interactions. Give your health care provider a list of all the medicines, herbs, non-prescription drugs, or dietary supplements you use. Also tell them if you smoke, drink alcohol, or use illegal drugs. Some items may interact with your medicine. What should I watch for while using this medication? Tell your care team if your symptoms do not get better or if they get worse. Visit  your care team for regular checks on your progress. Because it may take several weeks to see the full effects of this medication, it is important to continue your treatment as prescribed by your care team. Watch for new or worsening thoughts of suicide or depression. This includes sudden changes in mood, behaviors, or thoughts. These changes can happen at any time but are more common in the beginning of treatment or after a change in dose. Call your care team right away if you experience these thoughts or worsening depression. Manic episodes may happen in patients with bipolar disorder who take this medication. Watch for changes in feelings or behaviors such as feeling anxious, nervous, agitated, panicky, irritable, hostile, aggressive, impulsive, severely restless, overly excited and hyperactive, or trouble sleeping. These changes can happen at any time but are more common in the beginning of treatment or after a change in dose. Call your care team right away if you notice any of these symptoms. You may get drowsy or dizzy. Do not drive, use machinery, or do anything that needs mental alertness until you know how this medication affects you. Do not stand or sit up quickly, especially if you are an older patient. This reduces the risk of dizzy or fainting spells. Alcohol may interfere with the effect of this medication. Avoid alcoholic drinks. This medication may cause  dry eyes and blurred vision. If you wear contact lenses you may feel some discomfort. Lubricating drops may help. See your eye doctor if the problem does not go away or is severe. Your mouth may get dry. Chewing sugarless gum, sucking hard candy and drinking plenty of water may help. Contact your care team if the problem does not go away or is severe. What side effects may I notice from receiving this medication? Side effects that you should report to your care team as soon as possible: Allergic reactions--skin rash, itching, hives, swelling  of the face, lips, tongue, or throat Bleeding--bloody or black, tar-like stools, red or dark brown urine, vomiting blood or brown material that looks like coffee grounds, small, red or purple spots on skin, unusual bleeding or bruising Heart rhythm changes--fast or irregular heartbeat, dizziness, feeling faint or lightheaded, chest pain, trouble breathing Low blood pressure--dizziness, feeling faint or lightheaded, blurry vision Low sodium level--muscle weakness, fatigue, dizziness, headache, confusion Prolonged or painful erection Serotonin syndrome--irritability, confusion, fast or irregular heartbeat, muscle stiffness, twitching muscles, sweating, high fever, seizures, chills, vomiting, diarrhea Sudden eye pain or change in vision such as blurry vision, seeing halos around lights, vision loss Thoughts of suicide or self-harm, worsening mood, feelings of depression Side effects that usually do not require medical attention (report to your care team if they continue or are bothersome): Change in sex drive or performance Constipation Dizziness Drowsiness Dry mouth This list may not describe all possible side effects. Call your doctor for medical advice about side effects. You may report side effects to FDA at 1-800-FDA-1088. Where should I keep my medication? Keep out of the reach of children and pets. Store at room temperature between 15 and 30 degrees C (59 to 86 degrees F). Protect from light. Keep container tightly closed. Throw away any unused medication after the expiration date. NOTE: This sheet is a summary. It may not cover all possible information. If you have questions about this medicine, talk to your doctor, pharmacist, or health care provider.  2023 Elsevier/Gold Standard (2020-03-06 00:00:00)

## 2021-08-28 NOTE — Progress Notes (Signed)
Virtual Visit via Video Note  I connected with Cynthia Walters  on 08/28/21 at  4:00 PM EDT by a video enabled telemedicine application and verified that I am speaking with the correct person using two identifiers.  Location patient: [place] Location provider:work or home office Persons participating in the virtual visit: patient, provider  I discussed the limitations and requested verbal permission for telemedicine visit. The patient expressed understanding and agreed to proceed.   HPI:  Acute telemedicine visit for chronic depression and anxiety: -Onset: chronic -she got off prozac per PCP recs due to fatty liver -parents both with dementia and worsening recently and this has been very stressful and she feels has contributed to worsening of her symptoms -Symptoms include: poor sleep, stress, feels depressed, feels overwhelmed -she is mostly concerned with the sleep, perfers to avoid sleep aides as they worsened her father's condition -Denies:denies SI, thoughts of self harm, hallucinations -she has a long term boyfriend she can talk with and feel ssupported -Pertinent past medical history: see below -Pertinent medication allergies:No Known Allergies -COVID-19 vaccine status:  Immunization History  Administered Date(s) Administered   Influenza,inj,Quad PF,6+ Mos 02/21/2014, 02/19/2016   Influenza-Unspecified 02/07/2015, 01/29/2020, 01/20/2021   PFIZER(Purple Top)SARS-COV-2 Vaccination 05/29/2019, 06/29/2019, 05/22/2021   Tdap 09/16/2011     ROS: See pertinent positives and negatives per HPI.  Past Medical History:  Diagnosis Date   Anxiety    Depression    Depression with anxiety 01/08/2014   Diabetes mellitus without complication ([place])    Hematuria    Hyperglycemia 03/19/2015   Hyperlipidemia, mixed 11/29/2015   Obesity 11/29/2015   Rosacea    Vitamin B12 deficiency    Vitamin D deficiency     Past Surgical History:  Procedure Laterality Date   CESAREAN SECTION  2000    DENTAL SURGERY  01/2011   tooth removed,implant   WISDOM TOOTH EXTRACTION  age 37     Current Outpatient Medications:    ibuprofen (ADVIL) 600 MG tablet, Take 1 tablet (600 mg total) by mouth every 6 (six) hours as needed., Disp: 30 tablet, Rfl: 0   metFORMIN (GLUCOPHAGE) 1000 MG tablet, TAKE ONE TABLET BY MOUTH TWICE DAILY WITH meals (Patient taking differently: daily with breakfast. TAKE ONE TABLET BY MOUTH TWICE DAILY WITH meals), Disp: 180 tablet, Rfl: 3   ondansetron (ZOFRAN-ODT) 4 MG disintegrating tablet, Take 1 tablet (4 mg total) by mouth every 8 (eight) hours as needed for nausea or vomiting., Disp: 20 tablet, Rfl: 0   traZODone (DESYREL) 50 MG tablet, Take 0.5-1 tablets (25-50 mg total) by mouth at bedtime., Disp: 30 tablet, Rfl: 1  EXAM:  VITALS per patient if applicable:  GENERAL: alert, oriented, appears well and in no acute distress  HEENT: atraumatic, conjunttiva clear, no obvious abnormalities on inspection of external nose and ears  NECK: normal movements of the head and neck  LUNGS: on inspection no signs of respiratory distress, breathing rate appears normal, no obvious gross SOB, gasping or wheezing  CV: no obvious cyanosis  MS: moves all visible extremities without noticeable abnormality  PSYCH/NEURO: pleasant and cooperative, no obvious depression or anxiety, speech and thought processing grossly intact  ASSESSMENT AND PLAN:  Discussed the following assessment and plan:  Poor sleep  Stress  Depression, recurrent (HCC)  -we discussed possible serious and likely etiologies, options for evaluation and workup, limitations of telemedicine visit vs in person visit, treatment, treatment risks and precautions. Pt is agreeable to treatment via telemedicine at this moment. Had a long talk  about options for assistance with regaining restorative sleep, treatment and management of stress and depression. Counseled on sleep hygeine at length and summary provided in  patient instructions. She currently is between PCPs because her PCP left. She has opted to try CBT and agrees to call [facility] to schedule - she asked that I put contact info in patient instructions. She also wants to try a low dose of Trazadone. Discussed risks. Her LFTs were normal on the last check. She opted to start at '25mg'$  nightly for 2 weeks, and increase to '50mg'$  nightly after that if needed. She agrees to follow up with me in 1 month if is unable to get appt with new PCP for follow up.  Scheduled follow up with PCP offered: Sent message to schedulers to assist and advised patient to contact PCP office to schedule if does not receive call back in next 24 hours. Advise to seek prompt virtual visit or in person care if worsening, new symptoms arise, or if is not improving with treatment as expected per our conversation of expected course. Discussed options for follow up care. Did let this patient know that I do telemedicine on Tuesdays and Thursdays for [place] and those are the days I am logged into the system. Advised to schedule follow up visit with PCP, [place] virtual visits or [facility] if any further questions or concerns to avoid delays in care.   I discussed the assessment and treatment plan with the patient. Greater than 30 minutes spent in counseling, exam, care coordination. The patient was provided an opportunity to ask questions and all were answered. The patient agreed with the plan and demonstrated an understanding of the instructions.     Lucretia Kern, DO

## 2021-09-10 DIAGNOSIS — F4321 Adjustment disorder with depressed mood: Secondary | ICD-10-CM | POA: Diagnosis not present

## 2021-09-13 IMAGING — US US ABDOMEN LIMITED
1 series · 14 of 25 positions shown · non-contrast
Comparison: None.

CLINICAL DATA: Elevated liver function studies.

EXAM:
ULTRASOUND ABDOMEN LIMITED RIGHT UPPER QUADRANT

[Series 1: us abdomen limited · 0.25mm/px · 14 of 46 slices shown]
[im 1/46]
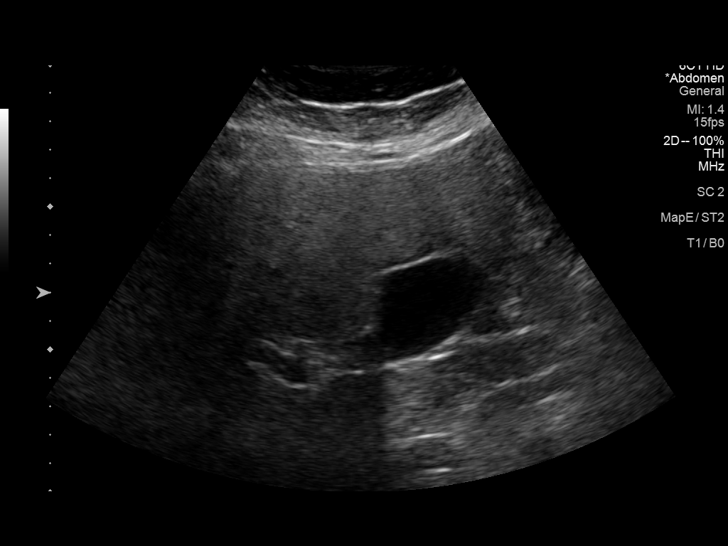
[im 4/46]
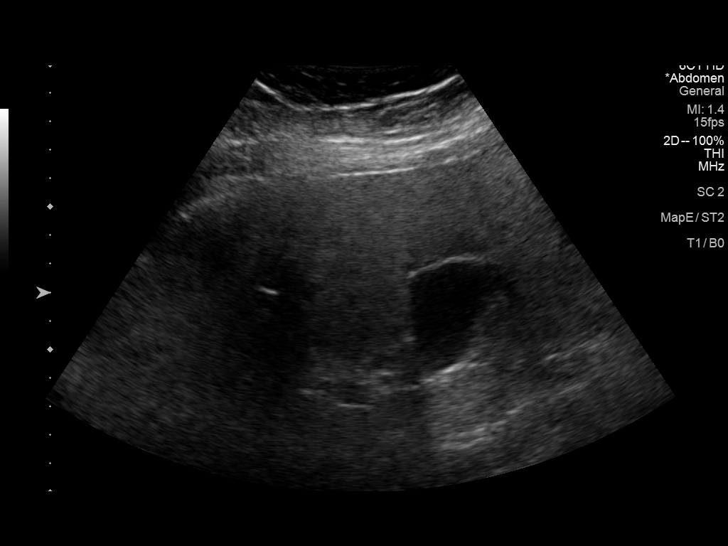
[im 8/46]
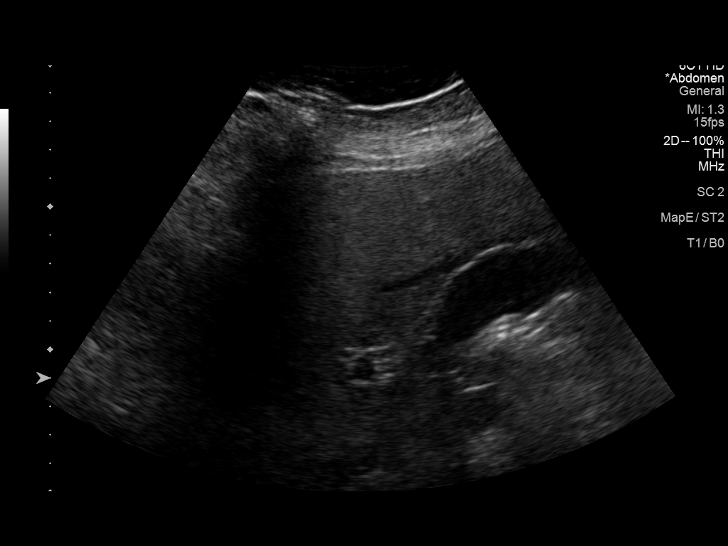
[im 12/46]
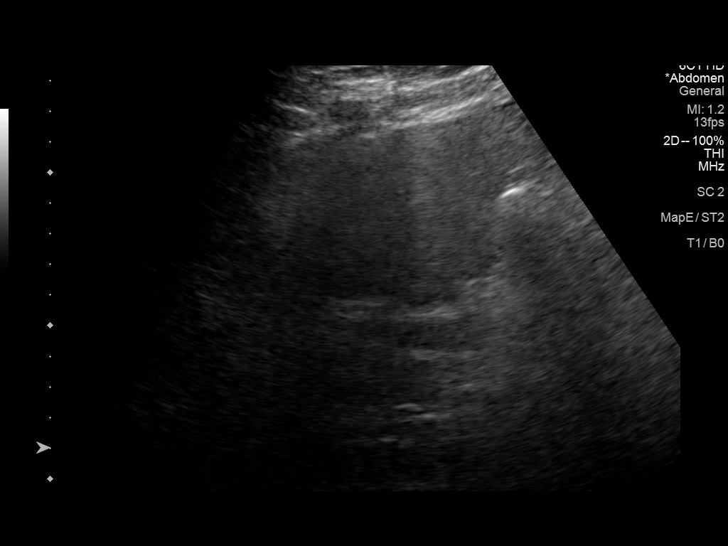
[im 16/46]
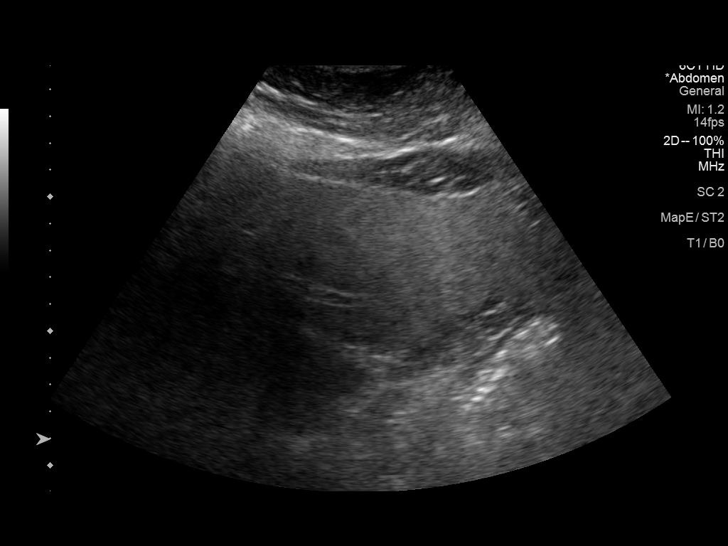
[im 17/46]
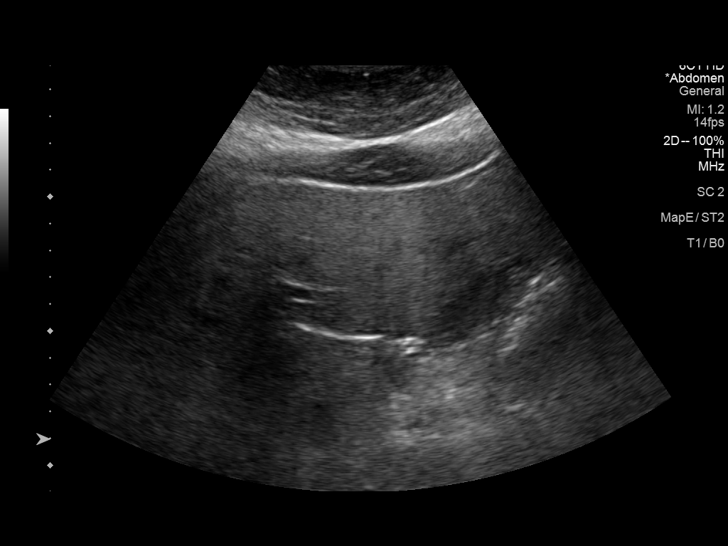
[im 21/46]
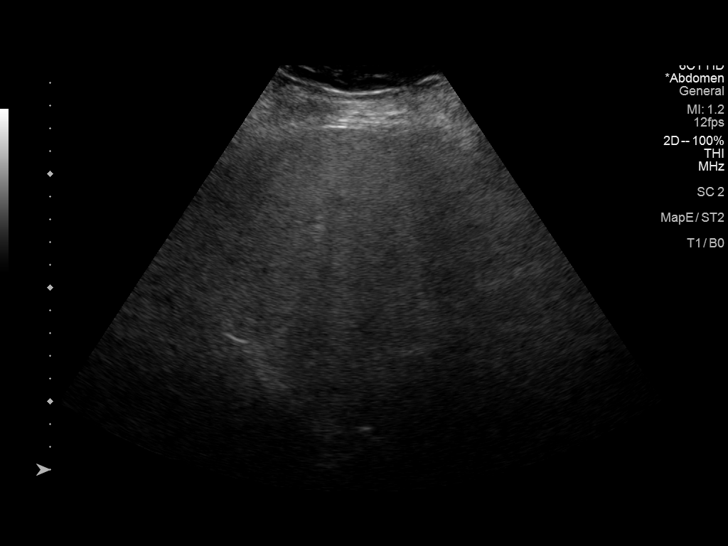
[im 25/46]
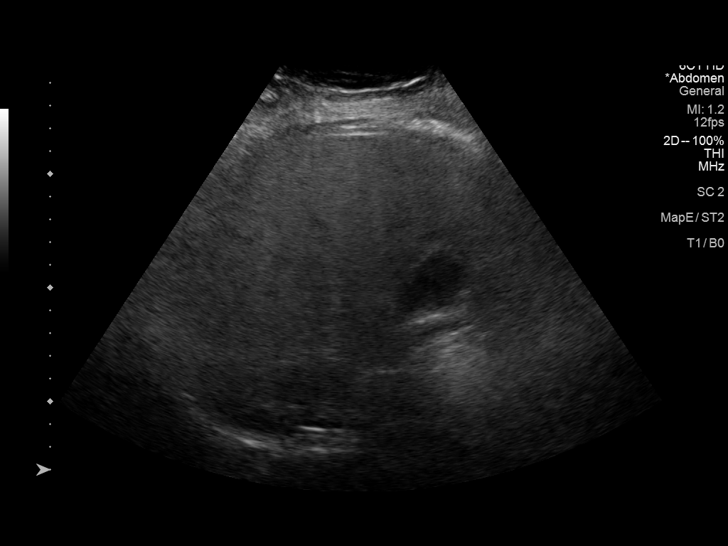
[im 29/46]
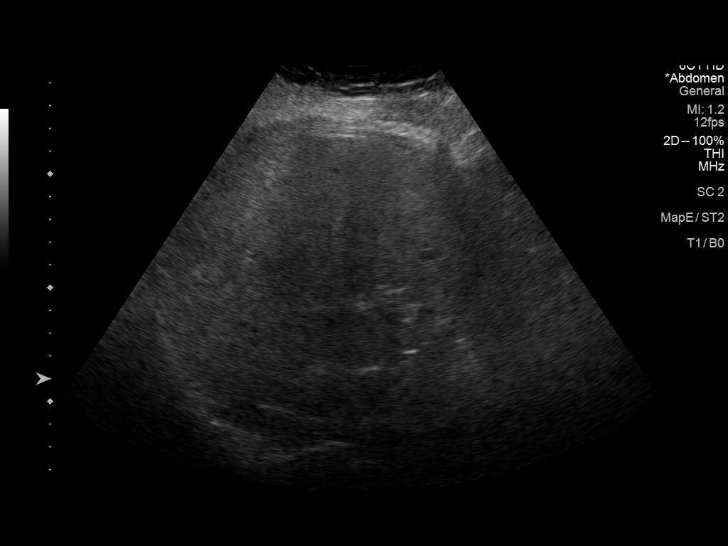
[im 31/46]
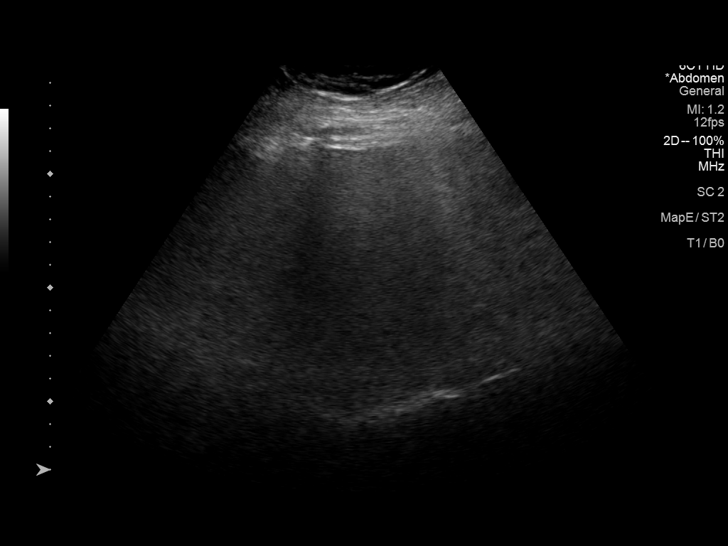
[im 34/46]
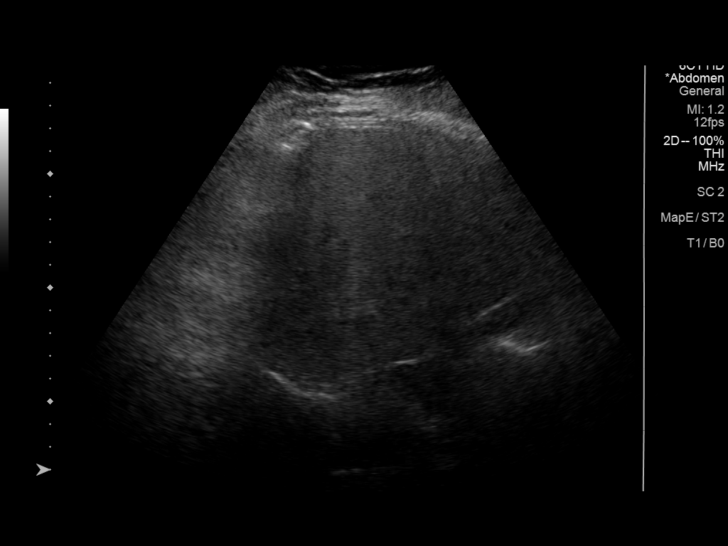
[im 38/46]
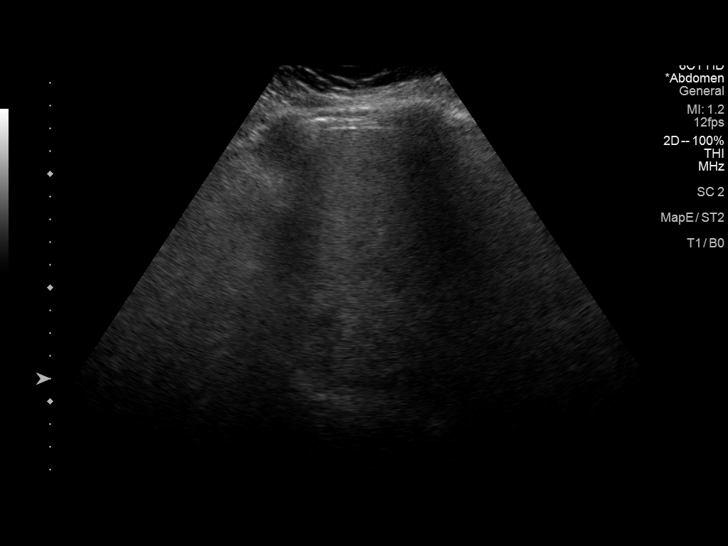
[im 42/46]
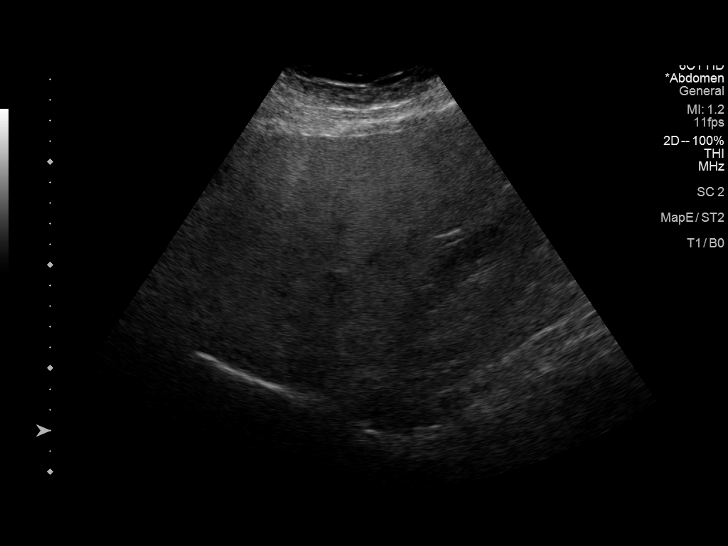
[im 46/46]
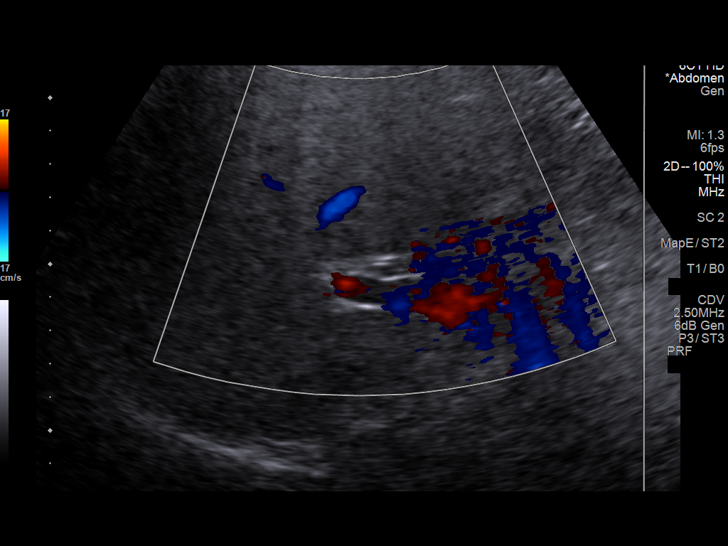

[14 of 25 positions shown; findings below may reference images not displayed]

FINDINGS: Gallbladder:

No gallstones or wall thickening visualized. No sonographic Murphy
sign noted by sonographer.

Common bile duct:

Diameter: 4 mm

Liver:

Coarsened hepatic echotexture without focal abnormality. The liver
is incompletely penetrated. Portal vein is patent on color Doppler
imaging with normal direction of blood flow towards the liver.

Other: No ascites identified.
IMPRESSION: 1. Nonspecific coarsened hepatic echotexture without focal
abnormality.
2. No evidence of cholelithiasis or biliary dilatation.

## 2021-09-18 DIAGNOSIS — F4321 Adjustment disorder with depressed mood: Secondary | ICD-10-CM | POA: Diagnosis not present

## 2021-10-05 ENCOUNTER — Other Ambulatory Visit: Payer: Self-pay | Admitting: Family Medicine

## 2021-10-08 DIAGNOSIS — Z1231 Encounter for screening mammogram for malignant neoplasm of breast: Secondary | ICD-10-CM | POA: Diagnosis not present

## 2021-10-09 ENCOUNTER — Telehealth (INDEPENDENT_AMBULATORY_CARE_PROVIDER_SITE_OTHER): Payer: BC Managed Care – PPO | Admitting: Family Medicine

## 2021-10-09 ENCOUNTER — Encounter: Payer: Self-pay | Admitting: Family Medicine

## 2021-10-09 ENCOUNTER — Encounter: Payer: Self-pay | Admitting: Obstetrics and Gynecology

## 2021-10-09 VITALS — Wt 197.0 lb

## 2021-10-09 DIAGNOSIS — F4321 Adjustment disorder with depressed mood: Secondary | ICD-10-CM | POA: Diagnosis not present

## 2021-10-09 DIAGNOSIS — F439 Reaction to severe stress, unspecified: Secondary | ICD-10-CM | POA: Diagnosis not present

## 2021-10-09 DIAGNOSIS — R4589 Other symptoms and signs involving emotional state: Secondary | ICD-10-CM | POA: Diagnosis not present

## 2021-10-09 DIAGNOSIS — G4700 Insomnia, unspecified: Secondary | ICD-10-CM | POA: Diagnosis not present

## 2021-10-09 MED ORDER — TRAZODONE HCL 50 MG PO TABS: 100.0000 mg | ORAL_TABLET | Freq: Every day | ORAL | 1 refills | Status: DC

## 2021-10-09 NOTE — Patient Instructions (Signed)
-  I sent the medication(s) we discussed to your pharmacy: Meds ordered this encounter  Medications   traZODone (DESYREL) 50 MG tablet    Sig: Take 2 tablets (100 mg total) by mouth at bedtime.    Dispense:  60 tablet    Refill:  1   Please increase the trazadone to '75mg'$  daily (1.5 tablets). If after a few weeks you feel that you may need to go up further may increase to '100mg'$  (2 tablets daily.)  Continue with therapy.  Schedule a follow up with your new primary care provider in about one month.  I hope you are feeling better soon! Hang in there!  Seek in person care promptly if your symptoms worsen, new concerns arise or you are not improving with treatment. Seek inperson care/emergency care immediately if any severe or life threatening symptoms or thoughts of harm or suicide. Call 911. You also can call the Suicide Hotline at 77 if Crisis/Suicidal thoughts and you need to talk to a counselor immediately for support.  It was nice to meet you today. I help [facility] out with telemedicine visits on Tuesdays and Thursdays and am happy to help if you need a virtual follow up visit on those days. Otherwise, if you have any concerns or questions following this visit please schedule a follow up visit with your Primary Care office or seek care at a local urgent care clinic to avoid delays in care. If you are having severe or life threatening symptoms please call 911 and/or go to the nearest emergency room.

## 2021-10-09 NOTE — Progress Notes (Signed)
Virtual Visit via Video Note  I connected with Cynthia Walters  on 10/09/21 at  4:40 PM EDT by a video enabled telemedicine application and verified that I am speaking with the correct person using two identifiers.  Location patient: [place] Location provider:work or home office Persons participating in the virtual visit: patient, provider  I discussed the limitations and requested verbal permission for telemedicine visit. The patient expressed understanding and agreed to proceed.   HPI:  Acute telemedicine visit for Insomnia and Depression: -trazadone has helped significantly with sleep - she is taking '50mg'$  daily (see notes from visit in June - reviewed) -between PCPs as current PCP just left practice -still having depressive symptoms -caring for ill parents -seeing a therapist, Waylan Boga, in [place] - has had 3 sessions -father has been in the hospital for brain bleeds for several weeks -Symptoms include: feels down many days, feels unmotivated and overwhelmed at times by all of the things that need to be done with taking care of her mom and dad and their estate -Denies: SI, thoughts of self harm, hallucinations -Has tried:wellburtin and SSRIs in the past -Pertinent past medical history: see below -Pertinent medication allergies:No Known Allergies    ROS: See pertinent positives and negatives per HPI.  Past Medical History:  Diagnosis Date   Anxiety    Depression    Depression with anxiety 01/08/2014   Diabetes mellitus without complication ([place])    Hematuria    Hyperglycemia 03/19/2015   Hyperlipidemia, mixed 11/29/2015   Obesity 11/29/2015   Rosacea    Vitamin B12 deficiency    Vitamin D deficiency     Past Surgical History:  Procedure Laterality Date   CESAREAN SECTION  2000   DENTAL SURGERY  01/2011   tooth removed,implant   WISDOM TOOTH EXTRACTION  age 80     Current Outpatient Medications:    metFORMIN (GLUCOPHAGE) 1000 MG tablet, TAKE ONE TABLET BY MOUTH TWICE  DAILY WITH meals (Patient taking differently: daily with breakfast.), Disp: 180 tablet, Rfl: 3   traZODone (DESYREL) 50 MG tablet, Take 2 tablets (100 mg total) by mouth at bedtime., Disp: 60 tablet, Rfl: 1  EXAM:  VITALS per patient if applicable:  GENERAL: alert, oriented, appears well and in no acute distress  HEENT: atraumatic, conjunttiva clear, no obvious abnormalities on inspection of external nose and ears  NECK: normal movements of the head and neck  LUNGS: on inspection no signs of respiratory distress, breathing rate appears normal, no obvious gross SOB, gasping or wheezing  CV: no obvious cyanosis  MS: moves all visible extremities without noticeable abnormality  PSYCH/NEURO: pleasant and cooperative, somewhat flat affect  ASSESSMENT AND PLAN:  Discussed the following assessment and plan:  Insomnia, unspecified type  Depressed mood  Situational stress  -we discussed possible serious and likely etiologies, options for evaluation and workup,  treatment, treatment risks and precautions. Pt is agreeable to treatment via telemedicine at this moment. I am so glad that she has been able to get plugged in with a therapist and that the trazadone has helped with her sleep. It is unfortunate that she is still having depressive symptoms, yet not surprising given the current situation. She plans to continue with therapy. She feels she is doing ok at work. Discussed various medication options. Has opted to try increasing trazadone to '75mg'$  for the next few weeks and up to '100mg'$  if needed. Sent new prescription. She agrees to schedule follow up in about 1 month with new PCP.  Scheduled follow up with PCP offered: Sent message to schedulers to assist and advised patient to contact PCP office to schedule if does not receive call back in next 24 hours. Advise to seek prompt virtual visit or in person care if worsening, new symptoms arise, or if is not improving with treatment as expected  per our conversation of expected course. Discussed options for follow up care. Did let this patient know that I do telemedicine on Tuesdays and Thursdays for [place] and those are the days I am logged into the system. Advised to schedule follow up visit with PCP, [place] virtual visits or [facility] if any further questions or concerns to avoid delays in care.   I discussed the assessment and treatment plan with the patient. The patient was provided an opportunity to ask questions and all were answered. The patient agreed with the plan and demonstrated an understanding of the instructions.     Lucretia Kern, DO

## 2021-10-13 NOTE — Progress Notes (Deleted)
56 y.o. G59P1001 Divorced Caucasian female here for annual exam.    PCP:   [name], MD  Patient's last menstrual period was 10/31/2018 (within weeks).           Sexually active: {yes no:314532}  The current method of family planning is vasectomy.    Exercising: {yes no:314532}  {types:19826} Smoker:  no  Health Maintenance: Pap:   07/22/17 Neg:Neg HR HPV, 07/03/14 Neg:Neg HR HPV History of abnormal Pap:  no MMG:  10-08-21 Neg/Birads1 Colonoscopy:   10/29/17 f/u 5-10 years BMD:   n/a  Result  n/a TDaP:  ***2013 Gardasil:   n/a HIV: never Hep C: 12-01-19 Neg Screening Labs:  Hb today: ***, Urine today: ***   reports that she has never smoked. She has never used smokeless tobacco. She reports current alcohol use of about 5.0 standard drinks of alcohol per week. She reports that she does not use drugs.  Past Medical History:  Diagnosis Date   Anxiety    Depression    Depression with anxiety 01/08/2014   Diabetes mellitus without complication ([place])    Hematuria    Hyperglycemia 03/19/2015   Hyperlipidemia, mixed 11/29/2015   Obesity 11/29/2015   Rosacea    Vitamin B12 deficiency    Vitamin D deficiency     Past Surgical History:  Procedure Laterality Date   CESAREAN SECTION  2000   DENTAL SURGERY  01/2011   tooth removed,implant   WISDOM TOOTH EXTRACTION  age 24    Current Outpatient Medications  Medication Sig Dispense Refill   metFORMIN (GLUCOPHAGE) 1000 MG tablet TAKE ONE TABLET BY MOUTH TWICE DAILY WITH meals (Patient taking differently: daily with breakfast.) 180 tablet 3   traZODone (DESYREL) 50 MG tablet Take 2 tablets (100 mg total) by mouth at bedtime. 60 tablet 1   No current facility-administered medications for this visit.    Family History  Problem Relation Age of Onset   Thyroid disease Mother    Dementia Mother        alzheimers   Bipolar disorder Father    Obesity Father    Hearing loss Father        does run in family   Dementia Father         per patient; but not formally tested   Depression Brother    Depression Brother    Hypertension Maternal Grandmother    Heart attack Maternal Grandmother 92   ALS Maternal Grandfather    Stroke Paternal Grandmother 14   Diabetes Paternal Grandmother    Stroke Paternal Grandfather 91    Review of Systems  Exam:   LMP 10/31/2018 (Within Weeks)     General appearance: alert, cooperative and appears stated age Head: normocephalic, without obvious abnormality, atraumatic Neck: no adenopathy, supple, symmetrical, trachea midline and thyroid normal to inspection and palpation Lungs: clear to auscultation bilaterally Breasts: normal appearance, no masses or tenderness, No nipple retraction or dimpling, No nipple discharge or bleeding, No axillary adenopathy Heart: regular rate and rhythm Abdomen: soft, non-tender; no masses, no organomegaly Extremities: extremities normal, atraumatic, no cyanosis or edema Skin: skin color, texture, turgor normal. No rashes or lesions Lymph nodes: cervical, supraclavicular, and axillary nodes normal. Neurologic: grossly normal  Pelvic: External genitalia:  no lesions              No abnormal inguinal nodes palpated.              Urethra:  normal appearing urethra with no masses, tenderness  or lesions              Bartholins and Skenes: normal                 Vagina: normal appearing vagina with normal color and discharge, no lesions              Cervix: no lesions              Pap taken: {yes no:314532} Bimanual Exam:  Uterus:  normal size, contour, position, consistency, mobility, non-tender              Adnexa: no mass, fullness, tenderness              Rectal exam: {yes no:314532}.  Confirms.              Anus:  normal sphincter tone, no lesions  Chaperone was present for exam:  ***  Assessment:   Well woman visit with gynecologic exam.   Plan: Mammogram screening discussed. Self breast awareness reviewed. Pap and HR HPV as  above. Guidelines for Calcium, Vitamin D, regular exercise program including cardiovascular and weight bearing exercise.   Follow up annually and prn.   Additional counseling given.  {yes [id number]. _______ minutes face to face time of which over 50% was spent in counseling.    After visit summary provided.

## 2021-10-14 DIAGNOSIS — R350 Frequency of micturition: Secondary | ICD-10-CM | POA: Diagnosis not present

## 2021-10-14 DIAGNOSIS — F418 Other specified anxiety disorders: Secondary | ICD-10-CM | POA: Diagnosis not present

## 2021-10-14 DIAGNOSIS — E538 Deficiency of other specified B group vitamins: Secondary | ICD-10-CM | POA: Diagnosis not present

## 2021-10-14 DIAGNOSIS — E1169 Type 2 diabetes mellitus with other specified complication: Secondary | ICD-10-CM | POA: Diagnosis not present

## 2021-10-14 DIAGNOSIS — E785 Hyperlipidemia, unspecified: Secondary | ICD-10-CM | POA: Diagnosis not present

## 2021-10-14 DIAGNOSIS — E1165 Type 2 diabetes mellitus with hyperglycemia: Secondary | ICD-10-CM | POA: Diagnosis not present

## 2021-10-14 DIAGNOSIS — F430 Acute stress reaction: Secondary | ICD-10-CM | POA: Diagnosis not present

## 2021-10-14 DIAGNOSIS — E559 Vitamin D deficiency, unspecified: Secondary | ICD-10-CM | POA: Diagnosis not present

## 2021-10-14 DIAGNOSIS — E669 Obesity, unspecified: Secondary | ICD-10-CM | POA: Diagnosis not present

## 2021-10-14 DIAGNOSIS — Z6831 Body mass index (BMI) 31.0-31.9, adult: Secondary | ICD-10-CM | POA: Diagnosis not present

## 2021-10-14 DIAGNOSIS — Z818 Family history of other mental and behavioral disorders: Secondary | ICD-10-CM | POA: Diagnosis not present

## 2021-10-15 ENCOUNTER — Ambulatory Visit: Payer: BC Managed Care – PPO | Admitting: Obstetrics and Gynecology

## 2021-10-15 DIAGNOSIS — F4321 Adjustment disorder with depressed mood: Secondary | ICD-10-CM | POA: Diagnosis not present

## 2021-10-29 DIAGNOSIS — F4321 Adjustment disorder with depressed mood: Secondary | ICD-10-CM | POA: Diagnosis not present

## 2021-10-29 NOTE — Progress Notes (Signed)
56 y.o. G65P1001 Divorced Caucasian female here for annual exam.    Hx kidney stone 05/2021. [facility] following urines now--has appt. In 2 weeks. Has had hematuria.   Denies vaginal bleeding.   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 Patient tried Prozac but didn't like the way it made her feel. Now trying Klonopin. Hx of anxiety and depression.  She is seeing her PCP.   PCP:  Daphane Shepherd, PA   Patient's last menstrual period was 10/31/2018 (within weeks).           Sexually active: No.  The current method of family planning is vasectomy.    Exercising: Yes.    The patient does not participate in regular exercise at present. Smoker:  no  Health Maintenance: Pap:  07/22/17 Neg:Neg HR HPV,  07/03/14 Neg:Neg HR HPV History of abnormal Pap:  no MMG:  10-08-21 Neg/BiRads1 Colonoscopy:  2019;next 5 years BMD:   n/a  Result  n/a TDaP:  PCP Gardasil:   no HIV:no Hep C: 12-01-19 Neg Screening Labs:  PCP   reports that she has never smoked. She has never used smokeless tobacco. She reports current alcohol use of about 2.0 standard drinks of alcohol per week. She reports that she does not use drugs.  Past Medical History:  Diagnosis Date   Anxiety    Depression    Depression with anxiety 01/08/2014   Diabetes mellitus without complication ([place])    Hematuria    History of kidney stones 05/28/2021   Hyperglycemia 03/19/2015   Hyperlipidemia, mixed 11/29/2015   Obesity 11/29/2015   Rosacea    Vitamin B12 deficiency    Vitamin D deficiency     Past Surgical History:  Procedure Laterality Date   CESAREAN SECTION  2000   DENTAL SURGERY  01/2011   tooth removed,implant   WISDOM TOOTH EXTRACTION  age 12    Current Outpatient Medications  Medication Sig Dispense Refill   clonazePAM (KLONOPIN) 0.5 MG tablet Take by mouth.     metFORMIN (GLUCOPHAGE) 1000 MG tablet TAKE ONE TABLET BY MOUTH TWICE DAILY WITH meals (Patient taking differently: daily with breakfast.) 180 tablet 3    traZODone (DESYREL) 50 MG tablet Take 2 tablets (100 mg total) by mouth at bedtime. 60 tablet 1   Cholecalciferol (VITAMIN D-1000 MAX ST) 25 MCG (1000 UT) tablet Take by mouth. (Patient not taking: Reported on 10/31/2021)     No current facility-administered medications for this visit.    Family History  Problem Relation Age of Onset   Thyroid disease Mother    Dementia Mother        alzheimers   Bipolar disorder Father    Obesity Father    Hearing loss Father        does run in family   Dementia Father        per patient; but not formally tested   Depression Brother    Depression Brother    Hypertension Maternal Grandmother    Heart attack Maternal Grandmother 22   ALS Maternal Grandfather    Stroke Paternal Grandmother 4   Diabetes Paternal Grandmother    Stroke Paternal Grandfather 60    Review of Systems  Psychiatric/Behavioral:  The patient is nervous/anxious (stressed).   All other systems reviewed and are negative.   Exam:   BP 114/80   Pulse 96   Ht '5\' 7"'$  (1.702 m)   Wt 195 lb (88.5 kg)   LMP 10/31/2018 (Within Weeks)   SpO2 99%  BMI 30.54 kg/m     General appearance: alert, cooperative and appears stated age Head: normocephalic, without obvious abnormality, atraumatic Neck: no adenopathy, supple, symmetrical, trachea midline and thyroid normal to inspection and palpation Lungs: clear to auscultation bilaterally Breasts: normal appearance, no masses or tenderness, No nipple retraction or dimpling, No nipple discharge or bleeding, No axillary adenopathy Heart: regular rate and rhythm Abdomen: soft, non-tender; no masses, no organomegaly Extremities: extremities normal, atraumatic, no cyanosis or edema Skin: skin color, texture, turgor normal. No rashes or lesions Lymph nodes: cervical, supraclavicular, and axillary nodes normal. Neurologic: grossly normal  Pelvic: External genitalia:  no lesions              No abnormal inguinal nodes palpated.               Urethra:  normal appearing urethra with no masses, tenderness or lesions              Bartholins and Skenes: normal                 Vagina: normal appearing vagina with normal color and discharge, no lesions              Cervix: no lesions              Pap taken: no Bimanual Exam:  Uterus:  normal size, contour, position, consistency, mobility, non-tender              Adnexa: no mass, fullness, tenderness              Rectal exam: yes.  Confirms.              Anus:  normal sphincter tone, no lesions  Chaperone was present for exam:  Estill Bamberg, CMA  Assessment:   Well woman visit with gynecologic exam. Caregiver stress.   Plan: Mammogram screening discussed. Self breast awareness reviewed. Pap and HR HPV 2024. Guidelines for Calcium, Vitamin D, regular exercise program including cardiovascular and weight bearing exercise. List of providers for psychiatric care and counselors to patient.  I recommended increasing her circle of support.  I discussed her parents physicians, Palliative care, Alzheimer's [employer], and spiritual advisors as possibilities.  Follow up annually and prn.   After visit summary provided.

## 2021-10-31 ENCOUNTER — Encounter: Payer: Self-pay | Admitting: Obstetrics and Gynecology

## 2021-10-31 ENCOUNTER — Ambulatory Visit (INDEPENDENT_AMBULATORY_CARE_PROVIDER_SITE_OTHER): Payer: BC Managed Care – PPO | Admitting: Obstetrics and Gynecology

## 2021-10-31 VITALS — BP 114/80 | HR 96 | Ht 67.0 in | Wt 195.0 lb

## 2021-10-31 DIAGNOSIS — Z01419 Encounter for gynecological examination (general) (routine) without abnormal findings: Secondary | ICD-10-CM | POA: Diagnosis not present

## 2021-10-31 DIAGNOSIS — Z636 Dependent relative needing care at home: Secondary | ICD-10-CM

## 2021-10-31 NOTE — Patient Instructions (Signed)

## 2021-11-02 ENCOUNTER — Encounter: Payer: Self-pay | Admitting: Obstetrics and Gynecology

## 2021-11-06 DIAGNOSIS — F430 Acute stress reaction: Secondary | ICD-10-CM | POA: Diagnosis not present

## 2021-11-06 DIAGNOSIS — R3121 Asymptomatic microscopic hematuria: Secondary | ICD-10-CM | POA: Diagnosis not present

## 2021-11-13 DIAGNOSIS — F4321 Adjustment disorder with depressed mood: Secondary | ICD-10-CM | POA: Diagnosis not present

## 2021-11-24 DIAGNOSIS — L718 Other rosacea: Secondary | ICD-10-CM | POA: Diagnosis not present

## 2021-11-24 DIAGNOSIS — D225 Melanocytic nevi of trunk: Secondary | ICD-10-CM | POA: Diagnosis not present

## 2021-11-27 DIAGNOSIS — F4321 Adjustment disorder with depressed mood: Secondary | ICD-10-CM | POA: Diagnosis not present

## 2021-12-04 DIAGNOSIS — E1165 Type 2 diabetes mellitus with hyperglycemia: Secondary | ICD-10-CM | POA: Diagnosis not present

## 2021-12-04 DIAGNOSIS — F4321 Adjustment disorder with depressed mood: Secondary | ICD-10-CM | POA: Diagnosis not present

## 2021-12-04 DIAGNOSIS — F418 Other specified anxiety disorders: Secondary | ICD-10-CM | POA: Diagnosis not present

## 2021-12-11 DIAGNOSIS — F4321 Adjustment disorder with depressed mood: Secondary | ICD-10-CM | POA: Diagnosis not present

## 2021-12-18 DIAGNOSIS — F4321 Adjustment disorder with depressed mood: Secondary | ICD-10-CM | POA: Diagnosis not present

## 2021-12-25 DIAGNOSIS — F4321 Adjustment disorder with depressed mood: Secondary | ICD-10-CM | POA: Diagnosis not present

## 2022-01-01 DIAGNOSIS — E1169 Type 2 diabetes mellitus with other specified complication: Secondary | ICD-10-CM | POA: Diagnosis not present

## 2022-01-01 DIAGNOSIS — F4321 Adjustment disorder with depressed mood: Secondary | ICD-10-CM | POA: Diagnosis not present

## 2022-01-01 DIAGNOSIS — E785 Hyperlipidemia, unspecified: Secondary | ICD-10-CM | POA: Diagnosis not present

## 2022-01-01 DIAGNOSIS — F418 Other specified anxiety disorders: Secondary | ICD-10-CM | POA: Diagnosis not present

## 2022-01-01 DIAGNOSIS — Z23 Encounter for immunization: Secondary | ICD-10-CM | POA: Diagnosis not present

## 2022-01-01 DIAGNOSIS — E1165 Type 2 diabetes mellitus with hyperglycemia: Secondary | ICD-10-CM | POA: Diagnosis not present

## 2022-01-08 DIAGNOSIS — H04123 Dry eye syndrome of bilateral lacrimal glands: Secondary | ICD-10-CM | POA: Diagnosis not present

## 2022-01-08 DIAGNOSIS — E119 Type 2 diabetes mellitus without complications: Secondary | ICD-10-CM | POA: Diagnosis not present

## 2022-01-13 ENCOUNTER — Ambulatory Visit: Payer: BC Managed Care – PPO | Admitting: Internal Medicine

## 2022-02-12 DIAGNOSIS — E1169 Type 2 diabetes mellitus with other specified complication: Secondary | ICD-10-CM | POA: Diagnosis not present

## 2022-02-12 DIAGNOSIS — E538 Deficiency of other specified B group vitamins: Secondary | ICD-10-CM | POA: Diagnosis not present

## 2022-02-12 DIAGNOSIS — Z6830 Body mass index (BMI) 30.0-30.9, adult: Secondary | ICD-10-CM | POA: Diagnosis not present

## 2022-02-12 DIAGNOSIS — E669 Obesity, unspecified: Secondary | ICD-10-CM | POA: Diagnosis not present

## 2022-02-12 DIAGNOSIS — E785 Hyperlipidemia, unspecified: Secondary | ICD-10-CM | POA: Diagnosis not present

## 2022-02-12 DIAGNOSIS — E559 Vitamin D deficiency, unspecified: Secondary | ICD-10-CM | POA: Diagnosis not present

## 2022-02-12 DIAGNOSIS — E1165 Type 2 diabetes mellitus with hyperglycemia: Secondary | ICD-10-CM | POA: Diagnosis not present

## 2022-02-17 DIAGNOSIS — F418 Other specified anxiety disorders: Secondary | ICD-10-CM | POA: Diagnosis not present

## 2022-02-17 DIAGNOSIS — Z23 Encounter for immunization: Secondary | ICD-10-CM | POA: Diagnosis not present

## 2022-02-17 DIAGNOSIS — E1165 Type 2 diabetes mellitus with hyperglycemia: Secondary | ICD-10-CM | POA: Diagnosis not present

## 2022-02-17 DIAGNOSIS — E785 Hyperlipidemia, unspecified: Secondary | ICD-10-CM | POA: Diagnosis not present

## 2022-02-17 DIAGNOSIS — E1169 Type 2 diabetes mellitus with other specified complication: Secondary | ICD-10-CM | POA: Diagnosis not present

## 2022-03-27 DIAGNOSIS — H9313 Tinnitus, bilateral: Secondary | ICD-10-CM | POA: Diagnosis not present

## 2022-03-27 DIAGNOSIS — F418 Other specified anxiety disorders: Secondary | ICD-10-CM | POA: Diagnosis not present

## 2022-05-01 DIAGNOSIS — F418 Other specified anxiety disorders: Secondary | ICD-10-CM | POA: Diagnosis not present

## 2022-05-01 DIAGNOSIS — R3915 Urgency of urination: Secondary | ICD-10-CM | POA: Diagnosis not present

## 2022-05-01 DIAGNOSIS — H9313 Tinnitus, bilateral: Secondary | ICD-10-CM | POA: Diagnosis not present

## 2022-06-04 DIAGNOSIS — E538 Deficiency of other specified B group vitamins: Secondary | ICD-10-CM | POA: Diagnosis not present

## 2022-06-04 DIAGNOSIS — E559 Vitamin D deficiency, unspecified: Secondary | ICD-10-CM | POA: Diagnosis not present

## 2022-06-04 DIAGNOSIS — E1165 Type 2 diabetes mellitus with hyperglycemia: Secondary | ICD-10-CM | POA: Diagnosis not present

## 2022-06-04 DIAGNOSIS — R5382 Chronic fatigue, unspecified: Secondary | ICD-10-CM | POA: Diagnosis not present

## 2022-06-04 DIAGNOSIS — R635 Abnormal weight gain: Secondary | ICD-10-CM | POA: Diagnosis not present

## 2022-06-04 DIAGNOSIS — E1169 Type 2 diabetes mellitus with other specified complication: Secondary | ICD-10-CM | POA: Diagnosis not present

## 2022-06-04 DIAGNOSIS — E785 Hyperlipidemia, unspecified: Secondary | ICD-10-CM | POA: Diagnosis not present

## 2022-06-12 DIAGNOSIS — E1165 Type 2 diabetes mellitus with hyperglycemia: Secondary | ICD-10-CM | POA: Diagnosis not present

## 2022-06-12 DIAGNOSIS — E785 Hyperlipidemia, unspecified: Secondary | ICD-10-CM | POA: Diagnosis not present

## 2022-06-12 DIAGNOSIS — E1169 Type 2 diabetes mellitus with other specified complication: Secondary | ICD-10-CM | POA: Diagnosis not present

## 2022-06-12 DIAGNOSIS — H9313 Tinnitus, bilateral: Secondary | ICD-10-CM | POA: Diagnosis not present

## 2022-06-12 DIAGNOSIS — F418 Other specified anxiety disorders: Secondary | ICD-10-CM | POA: Diagnosis not present

## 2022-09-11 DIAGNOSIS — E1169 Type 2 diabetes mellitus with other specified complication: Secondary | ICD-10-CM | POA: Diagnosis not present

## 2022-09-11 DIAGNOSIS — E785 Hyperlipidemia, unspecified: Secondary | ICD-10-CM | POA: Diagnosis not present

## 2022-09-11 DIAGNOSIS — E1165 Type 2 diabetes mellitus with hyperglycemia: Secondary | ICD-10-CM | POA: Diagnosis not present

## 2022-09-18 DIAGNOSIS — R232 Flushing: Secondary | ICD-10-CM | POA: Diagnosis not present

## 2022-09-18 DIAGNOSIS — E1169 Type 2 diabetes mellitus with other specified complication: Secondary | ICD-10-CM | POA: Diagnosis not present

## 2022-09-18 DIAGNOSIS — E1165 Type 2 diabetes mellitus with hyperglycemia: Secondary | ICD-10-CM | POA: Diagnosis not present

## 2022-09-18 DIAGNOSIS — Z1330 Encounter for screening examination for mental health and behavioral disorders, unspecified: Secondary | ICD-10-CM | POA: Diagnosis not present

## 2022-09-18 DIAGNOSIS — F331 Major depressive disorder, recurrent, moderate: Secondary | ICD-10-CM | POA: Diagnosis not present

## 2022-09-18 DIAGNOSIS — E785 Hyperlipidemia, unspecified: Secondary | ICD-10-CM | POA: Diagnosis not present

## 2022-11-11 DIAGNOSIS — R0681 Apnea, not elsewhere classified: Secondary | ICD-10-CM | POA: Diagnosis not present

## 2022-11-25 DIAGNOSIS — L718 Other rosacea: Secondary | ICD-10-CM | POA: Diagnosis not present

## 2022-11-25 DIAGNOSIS — D225 Melanocytic nevi of trunk: Secondary | ICD-10-CM | POA: Diagnosis not present

## 2022-11-25 DIAGNOSIS — G4733 Obstructive sleep apnea (adult) (pediatric): Secondary | ICD-10-CM | POA: Diagnosis not present

## 2022-12-10 DIAGNOSIS — E1165 Type 2 diabetes mellitus with hyperglycemia: Secondary | ICD-10-CM | POA: Diagnosis not present

## 2022-12-10 DIAGNOSIS — E1169 Type 2 diabetes mellitus with other specified complication: Secondary | ICD-10-CM | POA: Diagnosis not present

## 2022-12-10 DIAGNOSIS — Z0000 Encounter for general adult medical examination without abnormal findings: Secondary | ICD-10-CM | POA: Diagnosis not present

## 2022-12-10 DIAGNOSIS — E559 Vitamin D deficiency, unspecified: Secondary | ICD-10-CM | POA: Diagnosis not present

## 2022-12-10 DIAGNOSIS — R5382 Chronic fatigue, unspecified: Secondary | ICD-10-CM | POA: Diagnosis not present

## 2022-12-10 DIAGNOSIS — R232 Flushing: Secondary | ICD-10-CM | POA: Diagnosis not present

## 2022-12-10 DIAGNOSIS — R82998 Other abnormal findings in urine: Secondary | ICD-10-CM | POA: Diagnosis not present

## 2022-12-10 DIAGNOSIS — E785 Hyperlipidemia, unspecified: Secondary | ICD-10-CM | POA: Diagnosis not present

## 2022-12-16 DIAGNOSIS — Z1330 Encounter for screening examination for mental health and behavioral disorders, unspecified: Secondary | ICD-10-CM | POA: Diagnosis not present

## 2022-12-16 DIAGNOSIS — E1165 Type 2 diabetes mellitus with hyperglycemia: Secondary | ICD-10-CM | POA: Diagnosis not present

## 2022-12-16 DIAGNOSIS — E785 Hyperlipidemia, unspecified: Secondary | ICD-10-CM | POA: Diagnosis not present

## 2022-12-16 DIAGNOSIS — Z0000 Encounter for general adult medical examination without abnormal findings: Secondary | ICD-10-CM | POA: Diagnosis not present

## 2022-12-16 DIAGNOSIS — E1169 Type 2 diabetes mellitus with other specified complication: Secondary | ICD-10-CM | POA: Diagnosis not present

## 2022-12-16 DIAGNOSIS — F331 Major depressive disorder, recurrent, moderate: Secondary | ICD-10-CM | POA: Diagnosis not present

## 2022-12-16 DIAGNOSIS — Z23 Encounter for immunization: Secondary | ICD-10-CM | POA: Diagnosis not present

## 2023-01-12 DIAGNOSIS — Z1231 Encounter for screening mammogram for malignant neoplasm of breast: Secondary | ICD-10-CM | POA: Diagnosis not present

## 2023-01-13 ENCOUNTER — Encounter: Payer: Self-pay | Admitting: Obstetrics and Gynecology

## 2023-01-14 DIAGNOSIS — G4733 Obstructive sleep apnea (adult) (pediatric): Secondary | ICD-10-CM | POA: Diagnosis not present

## 2023-02-01 DIAGNOSIS — H2513 Age-related nuclear cataract, bilateral: Secondary | ICD-10-CM | POA: Diagnosis not present

## 2023-02-01 DIAGNOSIS — E119 Type 2 diabetes mellitus without complications: Secondary | ICD-10-CM | POA: Diagnosis not present

## 2023-02-01 DIAGNOSIS — H0102A Squamous blepharitis right eye, upper and lower eyelids: Secondary | ICD-10-CM | POA: Diagnosis not present

## 2023-02-01 DIAGNOSIS — H04123 Dry eye syndrome of bilateral lacrimal glands: Secondary | ICD-10-CM | POA: Diagnosis not present

## 2023-02-20 IMAGING — CT CT RENAL STONE PROTOCOL
2 of 4 series · 16 of 46 positions shown, 18 images · non-contrast
Comparison: No acute abnormality.

CLINICAL DATA: Right lower quadrant pain and nausea and vomiting.
Concern for renal stone.



[Series 2: axial st · axial · 0.87mm/px · z∈[+693,+1123]mm · 13 of 94 slices shown, 15 images]
[im 4/94  soft-tissue]
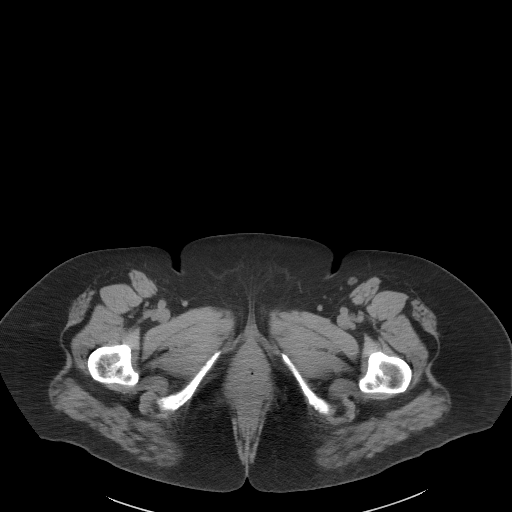
[im 4/94  bone]
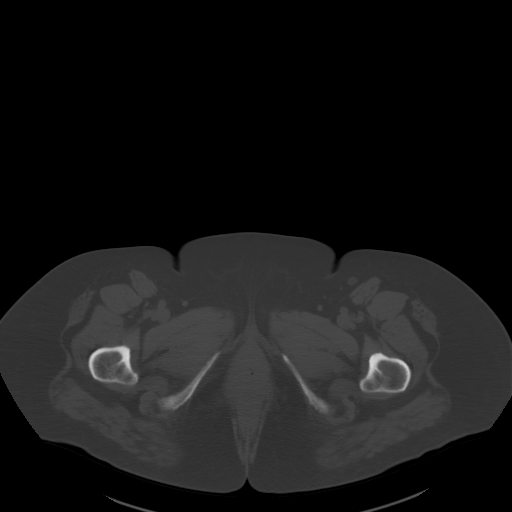
[im 11/94  soft-tissue]
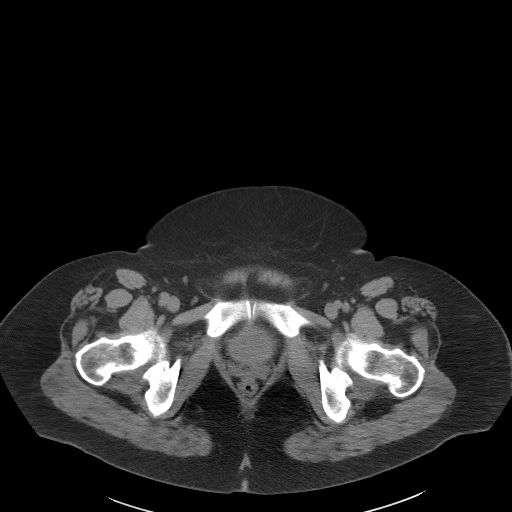
[im 18/94  soft-tissue]
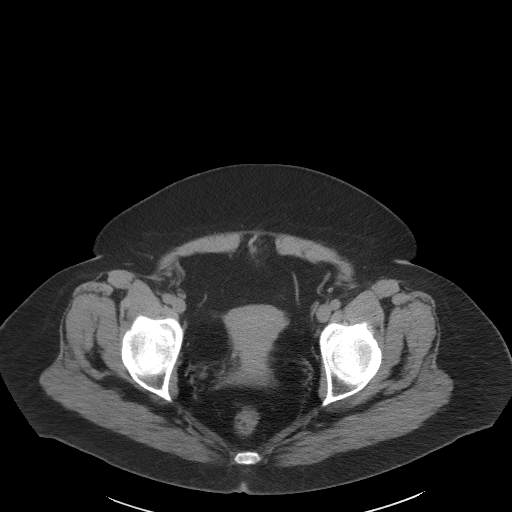
[im 26/94  soft-tissue]
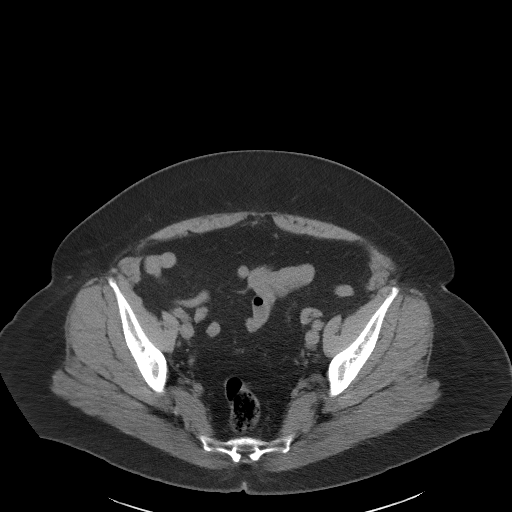
[im 33/94  soft-tissue]
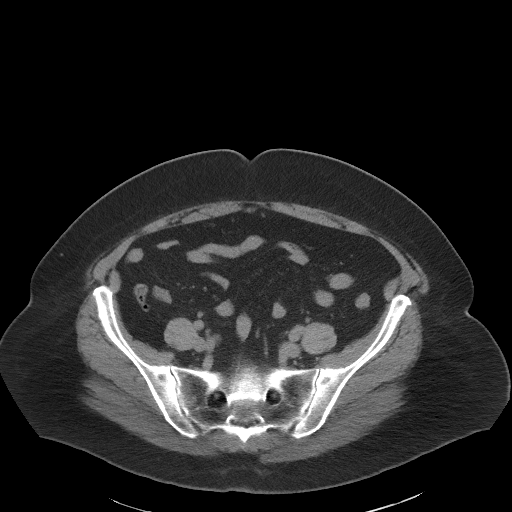
[im 40/94  soft-tissue]
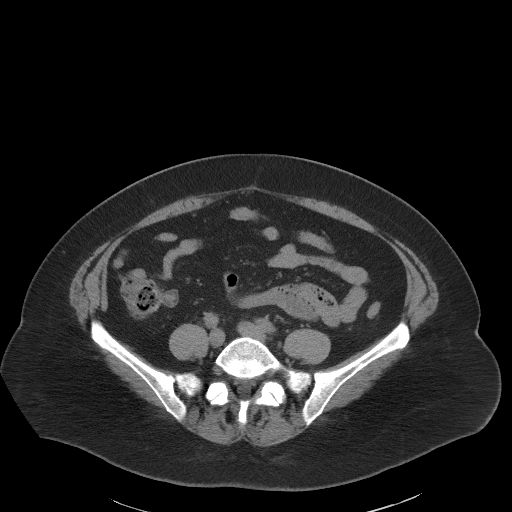
[im 47/94  soft-tissue]
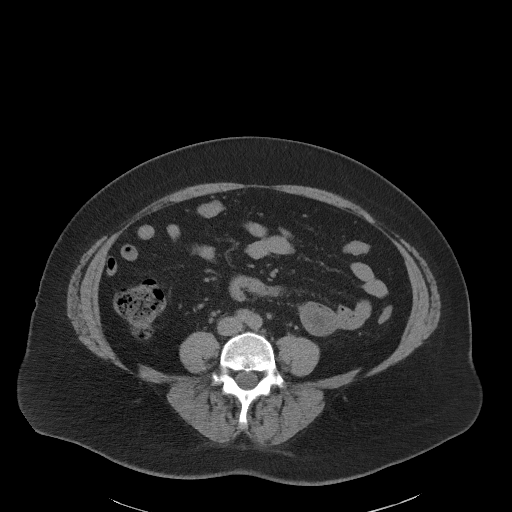
[im 54/94  soft-tissue]
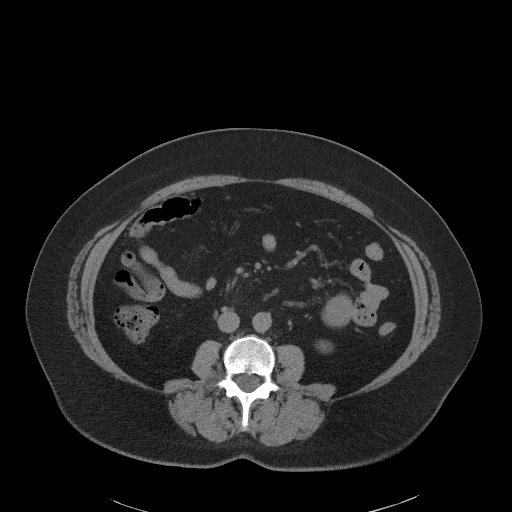
[im 61/94  soft-tissue]
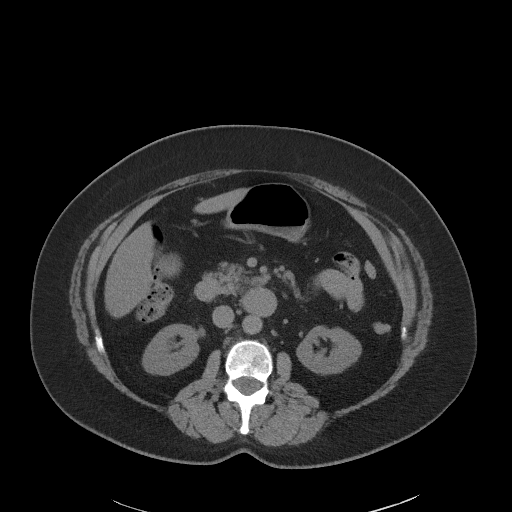
[im 61/94  bone]
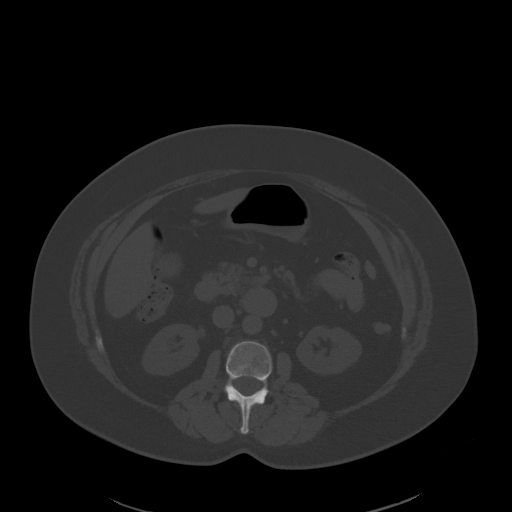
[im 68/94  soft-tissue]
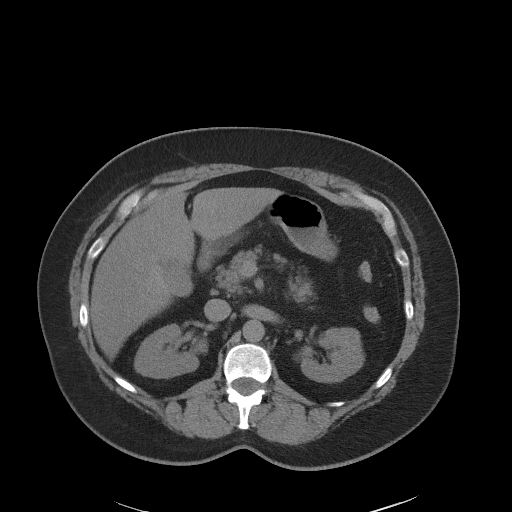
[im 76/94  soft-tissue]
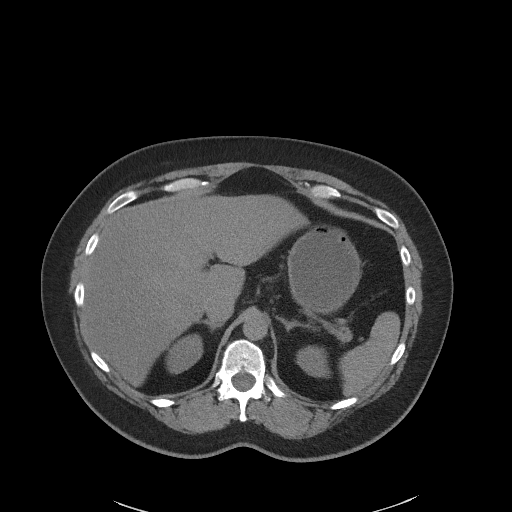
[im 83/94  soft-tissue]
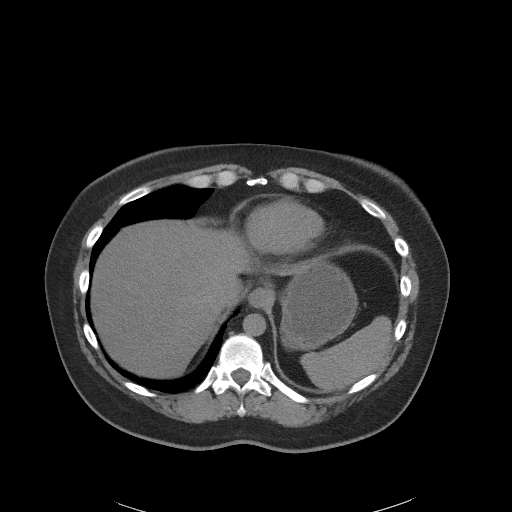
[im 90/94  soft-tissue]
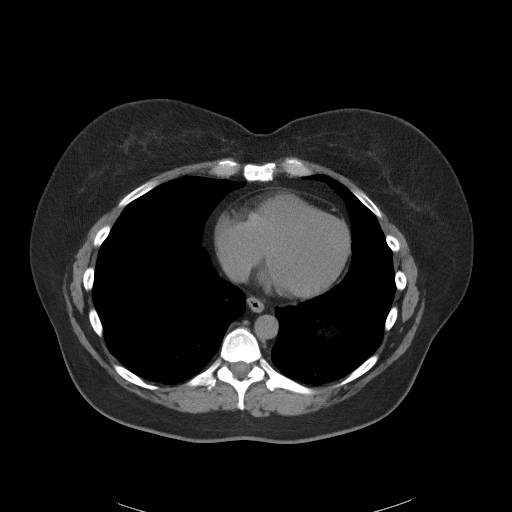

[Series 4: coronal st · coronal · 0.89mm/px · 3 of 107 slices shown]
[im 36/107  soft-tissue]
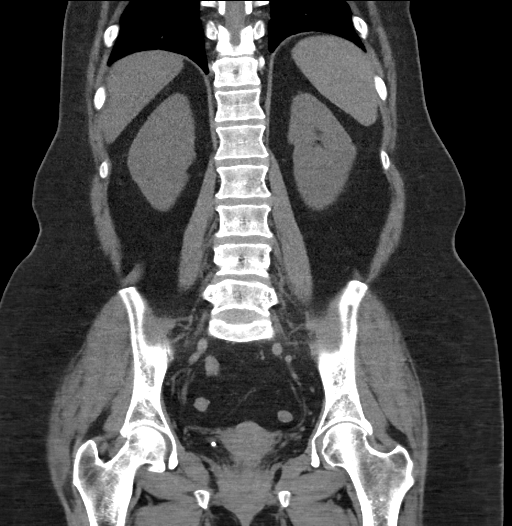
[im 48/107  soft-tissue]
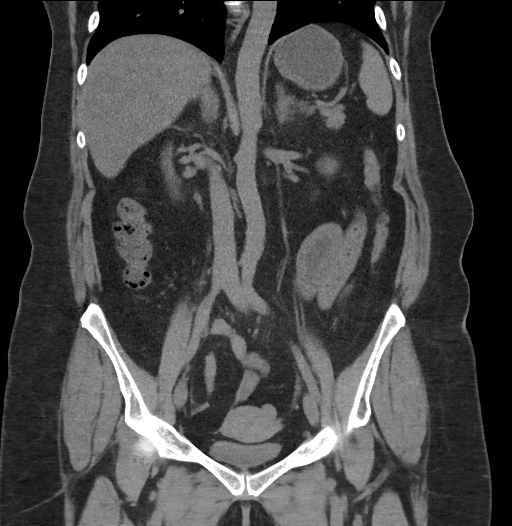
[im 59/107  soft-tissue]
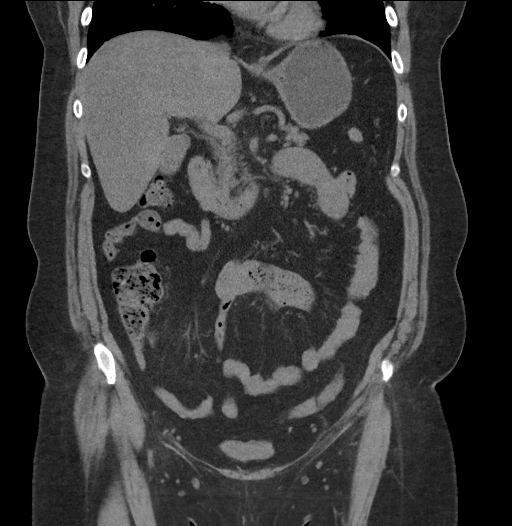

[16 of 46 positions shown; findings below may reference images not displayed]

FINDINGS: Lower chest: No acute abnormality.

Hepatobiliary: Hepatic steatosis with some focal sparing along the
gallbladder fossa. Gallbladder is unremarkable. Biliary ductal
dilation.

Pancreas: No pancreatic ductal dilation or evidence of acute
inflammation.

Spleen: No splenomegaly or focal splenic lesion.

Adrenals/Urinary Tract: Bilateral adrenal glands appear normal.
There is a 3 mm stone at the right ureterovesicular junction with
minimal upstream prominence and periureteric stranding. Additional
punctate right renal stone. Left kidney is normal. Urinary bladder
is unremarkable for degree of distension.

Stomach/Bowel: No radiopaque enteric contrast material was
administered. Stomach is unremarkable for degree of distension. No
pathologic dilation of small or large bowel. The appendix and
terminal ileum appear normal. No evidence of acute bowel
inflammation.

Vascular/Lymphatic: Normal caliber abdominal aorta. No
pathologically enlarged abdominal or pelvic lymph nodes.

Reproductive: Uterus and bilateral adnexa are unremarkable.

Other: No significant abdominopelvic free fluid.

Musculoskeletal: Mild multilevel degenerative changes spine.
IMPRESSION: 1. 3 mm stone at the right ureterovesicular junction with minimal
upstream collecting/ureter prominence and periureteric stranding.
2. Additional punctate right renal stone.
3. Hepatic steatosis.

## 2023-03-10 DIAGNOSIS — E1165 Type 2 diabetes mellitus with hyperglycemia: Secondary | ICD-10-CM | POA: Diagnosis not present

## 2023-03-10 DIAGNOSIS — E785 Hyperlipidemia, unspecified: Secondary | ICD-10-CM | POA: Diagnosis not present

## 2023-03-10 DIAGNOSIS — E1169 Type 2 diabetes mellitus with other specified complication: Secondary | ICD-10-CM | POA: Diagnosis not present

## 2023-03-17 DIAGNOSIS — E1165 Type 2 diabetes mellitus with hyperglycemia: Secondary | ICD-10-CM | POA: Diagnosis not present

## 2023-03-17 DIAGNOSIS — F331 Major depressive disorder, recurrent, moderate: Secondary | ICD-10-CM | POA: Diagnosis not present

## 2023-03-17 DIAGNOSIS — Z23 Encounter for immunization: Secondary | ICD-10-CM | POA: Diagnosis not present

## 2023-03-17 DIAGNOSIS — E1169 Type 2 diabetes mellitus with other specified complication: Secondary | ICD-10-CM | POA: Diagnosis not present

## 2023-03-17 DIAGNOSIS — E785 Hyperlipidemia, unspecified: Secondary | ICD-10-CM | POA: Diagnosis not present

## 2023-03-25 DIAGNOSIS — G4733 Obstructive sleep apnea (adult) (pediatric): Secondary | ICD-10-CM | POA: Diagnosis not present

## 2023-03-29 NOTE — Progress Notes (Signed)
 57 y.o. G38P1001 Divorced Caucasian female here for annual exam.    Has some depression but her anxiety is improved.  Taking Prozac .  Has counseling through [facility] and is doing transition care.   Feels hot at night.  Not having hot flashes during the day, but is more hot natured.   Has a lump on her labia.   Urgency to void when she gets home. Some leakage of urine with a cough and cold cold recently.  No leak with cough or lifting.   Father passed from lung cancer in July.  Mother is in the [place] area and has Alzheimer.  PCP: Juliane Che, PA   Patient's last menstrual period was 10/31/2018 (within weeks).           Sexually active: No.  Has intimate partner for 15 years.  Not having dryness issues.  The current method of family planning is vasectomy.    Menopausal hormone therapy:  n/a Exercising: No.   Smoker:  no  OB History  Gravida Para Term Preterm AB Living  1 1 1  0 0 1  SAB IAB Ectopic Multiple Live Births  0 0 0 0 1    # Outcome Date GA Lbr Len/2nd Weight Sex Type Anes PTL Lv  1 Term 04/02/98 [redacted]w[redacted]d   M CS-Unspec Spinal N LIV     HEALTH MAINTENANCE: Last 2 paps:  07/22/17 neg: HR HPV neg History of abnormal Pap or positive HPV:  no Mammogram:   01/12/23 Breast Density Cat A, BI-RADS CAT 1 neg Colonoscopy:  10/29/17 - polyp - Dr. Kristie.  She will check to see when she is due again.  Bone Density:  n/a  Result  n/a   Immunization History  Administered Date(s) Administered   Influenza,inj,Quad PF,6+ Mos 02/21/2014, 02/19/2016   Influenza-Unspecified 02/07/2015, 01/29/2020, 01/20/2021   PFIZER(Purple Top)SARS-COV-2 Vaccination 05/29/2019, 06/29/2019, 02/28/2020, 05/22/2021   Pfizer(Comirnaty)Fall Seasonal Vaccine 12 years and older 01/01/2022, 03/17/2023   Tdap 09/16/2011      reports that she has never smoked. She has never used smokeless tobacco. She reports current alcohol use of about 2.0 standard drinks of alcohol per week. She reports that she  does not use drugs.  Past Medical History:  Diagnosis Date   Anxiety    Depression    Depression with anxiety 01/08/2014   Diabetes mellitus without complication ([facility])    Hematuria    History of kidney stones 05/28/2021   Hyperglycemia 03/19/2015   Hyperlipidemia, mixed 11/29/2015   Obesity 11/29/2015   Rosacea    Vitamin B12 deficiency    Vitamin D  deficiency     Past Surgical History:  Procedure Laterality Date   CESAREAN SECTION  2000   DENTAL SURGERY  01/2011   tooth removed,implant   WISDOM TOOTH EXTRACTION  age 60    Current Outpatient Medications  Medication Sig Dispense Refill   Cholecalciferol (VITAMIN D -1000 MAX ST) 25 MCG (1000 UT) tablet Take by mouth.     clonazePAM (KLONOPIN) 0.5 MG tablet Take by mouth.     metFORMIN  (GLUCOPHAGE ) 1000 MG tablet TAKE ONE TABLET BY MOUTH TWICE DAILY WITH meals (Patient taking differently: daily with breakfast.) 180 tablet 3   traZODone  (DESYREL ) 50 MG tablet Take 2 tablets (100 mg total) by mouth at bedtime. 60 tablet 1   No current facility-administered medications for this visit.    ALLERGIES: Patient has no known allergies.  Family History  Problem Relation Age of Onset   Thyroid  disease Mother  Dementia Mother        alzheimers   Bipolar disorder Father    Obesity Father    Hearing loss Father        does run in family   Dementia Father        per patient; but not formally tested   Depression Brother    Depression Brother    Hypertension Maternal Grandmother    Heart attack Maternal Grandmother 9   ALS Maternal Grandfather    Stroke Paternal Grandmother 20   Diabetes Paternal Grandmother    Stroke Paternal Grandfather 19    Review of Systems  All other systems reviewed and are negative.   PHYSICAL EXAM:  BP 122/70 (BP Location: Left Arm, Patient Position: Sitting, Cuff Size: Large)   Pulse (!) 105   Ht 5' 8 (1.727 m)   Wt 213 lb (96.6 kg)   LMP 10/31/2018 (Within Weeks)   SpO2 96%   BMI 32.39  kg/m     General appearance: alert, cooperative and appears stated age Head: normocephalic, without obvious abnormality, atraumatic Neck: no adenopathy, supple, symmetrical, trachea midline and thyroid  normal to inspection and palpation Lungs: clear to auscultation bilaterally Breasts: normal appearance, no masses or tenderness, No nipple retraction or dimpling, No nipple discharge or bleeding, No axillary adenopathy Heart: regular rate and rhythm Abdomen: soft, non-tender; no masses, no organomegaly Extremities: extremities normal, atraumatic, no cyanosis or edema Skin: skin color, texture, turgor normal. No rashes or lesions Lymph nodes: cervical, supraclavicular, and axillary nodes normal. Neurologic: grossly normal  Pelvic: External genitalia:  5 - 6 mm sebaceous cyst of the left labia majora.              No abnormal inguinal nodes palpated.              Urethra:  normal appearing urethra with no masses, tenderness or lesions              Bartholins and Skenes: normal                 Vagina: normal appearing vagina with normal color and discharge, no lesions              Cervix: no lesions              Pap taken: Yes.   Bimanual Exam:  Uterus:  normal size, contour, position, consistency, mobility, non-tender              Adnexa: no mass, fullness, tenderness              Rectal exam: Yes.  .  Confirms.              Anus:  normal sphincter tone, no lesions  Chaperone was present for exam:  Reena DEL, CMA  ASSESSMENT: Well woman visit with gynecologic exam Cervical cancer screening.  Anxiety and depression.  Sebaceous cyst of the left labia majora.  Minor mixed incontinence.  PLAN: Mammogram screening discussed. Self breast awareness reviewed. Pap and HRV collected:  Yes.   Guidelines for Calcium , Vitamin D , regular exercise program including cardiovascular and weight bearing exercise. Medication refills:  NA If she desires, can do removal of the sebaceous cyst. Kegel's,  dietary irritants reviewed.  She will let me know if her bladder function worsens.  Follow up:  yearly and prn.

## 2023-04-12 ENCOUNTER — Other Ambulatory Visit (HOSPITAL_COMMUNITY)
Admission: RE | Admit: 2023-04-12 | Discharge: 2023-04-12 | Disposition: A | Discharge status: Home / Self Care | Payer: BC Managed Care – PPO | Source: Ambulatory Visit | Attending: Obstetrics and Gynecology | Admitting: Obstetrics and Gynecology

## 2023-04-12 ENCOUNTER — Encounter: Payer: Self-pay | Admitting: Obstetrics and Gynecology

## 2023-04-12 ENCOUNTER — Ambulatory Visit (INDEPENDENT_AMBULATORY_CARE_PROVIDER_SITE_OTHER): Payer: BC Managed Care – PPO | Admitting: Obstetrics and Gynecology

## 2023-04-12 VITALS — BP 122/70 | HR 105 | Ht 68.0 in | Wt 213.0 lb

## 2023-04-12 DIAGNOSIS — Z01419 Encounter for gynecological examination (general) (routine) without abnormal findings: Secondary | ICD-10-CM

## 2023-04-12 DIAGNOSIS — Z124 Encounter for screening for malignant neoplasm of cervix: Secondary | ICD-10-CM

## 2023-04-12 NOTE — Patient Instructions (Signed)

## 2023-04-13 LAB — CYTOLOGY - PAP
Comment: NEGATIVE
Diagnosis: NEGATIVE
High risk HPV: NEGATIVE

## 2023-04-27 ENCOUNTER — Other Ambulatory Visit: Payer: Self-pay | Admitting: Medical Genetics

## 2023-06-11 ENCOUNTER — Other Ambulatory Visit

## 2023-06-11 DIAGNOSIS — Z006 Encounter for examination for normal comparison and control in clinical research program: Secondary | ICD-10-CM

## 2023-06-14 DIAGNOSIS — E1169 Type 2 diabetes mellitus with other specified complication: Secondary | ICD-10-CM | POA: Diagnosis not present

## 2023-06-14 DIAGNOSIS — E785 Hyperlipidemia, unspecified: Secondary | ICD-10-CM | POA: Diagnosis not present

## 2023-06-14 DIAGNOSIS — E559 Vitamin D deficiency, unspecified: Secondary | ICD-10-CM | POA: Diagnosis not present

## 2023-06-14 DIAGNOSIS — E1165 Type 2 diabetes mellitus with hyperglycemia: Secondary | ICD-10-CM | POA: Diagnosis not present

## 2023-06-21 DIAGNOSIS — E1169 Type 2 diabetes mellitus with other specified complication: Secondary | ICD-10-CM | POA: Diagnosis not present

## 2023-06-21 DIAGNOSIS — E559 Vitamin D deficiency, unspecified: Secondary | ICD-10-CM | POA: Diagnosis not present

## 2023-06-21 DIAGNOSIS — E1165 Type 2 diabetes mellitus with hyperglycemia: Secondary | ICD-10-CM | POA: Diagnosis not present

## 2023-06-21 DIAGNOSIS — F331 Major depressive disorder, recurrent, moderate: Secondary | ICD-10-CM | POA: Diagnosis not present

## 2023-06-21 DIAGNOSIS — E785 Hyperlipidemia, unspecified: Secondary | ICD-10-CM | POA: Diagnosis not present

## 2023-06-22 LAB — GENECONNECT MOLECULAR SCREEN: Genetic Analysis Overall Interpretation: NEGATIVE

## 2023-07-02 DIAGNOSIS — F32A Depression, unspecified: Secondary | ICD-10-CM | POA: Diagnosis not present

## 2023-07-02 DIAGNOSIS — E119 Type 2 diabetes mellitus without complications: Secondary | ICD-10-CM | POA: Diagnosis not present

## 2023-07-02 DIAGNOSIS — N951 Menopausal and female climacteric states: Secondary | ICD-10-CM | POA: Diagnosis not present

## 2023-07-02 DIAGNOSIS — R6882 Decreased libido: Secondary | ICD-10-CM | POA: Diagnosis not present

## 2023-07-05 ENCOUNTER — Other Ambulatory Visit (HOSPITAL_BASED_OUTPATIENT_CLINIC_OR_DEPARTMENT_OTHER): Payer: Self-pay | Admitting: Obstetrics and Gynecology

## 2023-07-05 DIAGNOSIS — Z136 Encounter for screening for cardiovascular disorders: Secondary | ICD-10-CM

## 2023-07-19 ENCOUNTER — Ambulatory Visit (HOSPITAL_BASED_OUTPATIENT_CLINIC_OR_DEPARTMENT_OTHER)
Admission: RE | Admit: 2023-07-19 | Discharge: 2023-07-19 | Disposition: A | Discharge status: Home / Self Care | Payer: Self-pay | Source: Ambulatory Visit | Attending: Obstetrics and Gynecology | Admitting: Obstetrics and Gynecology

## 2023-07-19 DIAGNOSIS — Z136 Encounter for screening for cardiovascular disorders: Secondary | ICD-10-CM | POA: Insufficient documentation

## 2023-09-01 DIAGNOSIS — E66811 Obesity, class 1: Secondary | ICD-10-CM | POA: Diagnosis not present

## 2023-09-01 DIAGNOSIS — Z6834 Body mass index (BMI) 34.0-34.9, adult: Secondary | ICD-10-CM | POA: Diagnosis not present

## 2023-09-01 DIAGNOSIS — E1140 Type 2 diabetes mellitus with diabetic neuropathy, unspecified: Secondary | ICD-10-CM | POA: Diagnosis not present

## 2023-09-01 DIAGNOSIS — N951 Menopausal and female climacteric states: Secondary | ICD-10-CM | POA: Diagnosis not present

## 2023-10-05 ENCOUNTER — Ambulatory Visit (INDEPENDENT_AMBULATORY_CARE_PROVIDER_SITE_OTHER): Admitting: Family Medicine

## 2023-10-05 ENCOUNTER — Encounter: Payer: Self-pay | Admitting: Family Medicine

## 2023-10-05 VITALS — BP 115/75 | HR 85 | Temp 97.5°F | Resp 18 | Ht 68.0 in | Wt 216.5 lb

## 2023-10-05 DIAGNOSIS — E538 Deficiency of other specified B group vitamins: Secondary | ICD-10-CM

## 2023-10-05 DIAGNOSIS — K760 Fatty (change of) liver, not elsewhere classified: Secondary | ICD-10-CM | POA: Diagnosis not present

## 2023-10-05 DIAGNOSIS — E119 Type 2 diabetes mellitus without complications: Secondary | ICD-10-CM | POA: Diagnosis not present

## 2023-10-05 DIAGNOSIS — E782 Mixed hyperlipidemia: Secondary | ICD-10-CM | POA: Diagnosis not present

## 2023-10-05 DIAGNOSIS — E559 Vitamin D deficiency, unspecified: Secondary | ICD-10-CM

## 2023-10-05 DIAGNOSIS — Z7984 Long term (current) use of oral hypoglycemic drugs: Secondary | ICD-10-CM

## 2023-10-05 LAB — LIPID PANEL
Cholesterol: 184 mg/dL (ref 0–200)
HDL: 54.8 mg/dL (ref >39.00)
LDL Cholesterol: 101 mg/dL — ABNORMAL HIGH (ref 0–99)
NonHDL: 128.76
Total CHOL/HDL Ratio: 3
Triglycerides: 137 mg/dL (ref 0.0–149.0)
VLDL: 27.4 mg/dL (ref 0.0–40.0)

## 2023-10-05 LAB — VITAMIN D 25 HYDROXY (VIT D DEFICIENCY, FRACTURES): VITD: 47.34 ng/mL (ref 30.00–100.00)

## 2023-10-05 LAB — COMPREHENSIVE METABOLIC PANEL WITH GFR
ALT: 83 U/L — ABNORMAL HIGH (ref 0–35)
AST: 88 U/L — ABNORMAL HIGH (ref 0–37)
Albumin: 4.4 g/dL (ref 3.5–5.2)
Alkaline Phosphatase: 73 U/L (ref 39–117)
BUN: 16 mg/dL (ref 6–23)
CO2: 23 meq/L (ref 19–32)
Calcium: 9.3 mg/dL (ref 8.4–10.5)
Chloride: 102 meq/L (ref 96–112)
Creatinine, Ser: 0.68 mg/dL (ref 0.40–1.20)
GFR: 97.49 mL/min (ref >60.00)
Glucose, Bld: 196 mg/dL — ABNORMAL HIGH (ref 70–99)
Potassium: 4.3 meq/L (ref 3.5–5.1)
Sodium: 136 meq/L (ref 135–145)
Total Bilirubin: 0.6 mg/dL (ref 0.2–1.2)
Total Protein: 7.6 g/dL (ref 6.0–8.3)

## 2023-10-05 LAB — CBC WITH DIFFERENTIAL/PLATELET
Basophils Absolute: 0 K/uL (ref 0.0–0.1)
Basophils Relative: 0.6 % (ref 0.0–3.0)
Eosinophils Absolute: 0.3 K/uL (ref 0.0–0.7)
Eosinophils Relative: 5.1 % — ABNORMAL HIGH (ref 0.0–5.0)
HCT: 42 % (ref 36.0–46.0)
Hemoglobin: 14.2 g/dL (ref 12.0–15.0)
Lymphocytes Relative: 23.4 % (ref 12.0–46.0)
Lymphs Abs: 1.3 K/uL (ref 0.7–4.0)
MCHC: 33.8 g/dL (ref 30.0–36.0)
MCV: 88 fl (ref 78.0–100.0)
Monocytes Absolute: 0.5 K/uL (ref 0.1–1.0)
Monocytes Relative: 8.1 % (ref 3.0–12.0)
Neutro Abs: 3.6 K/uL (ref 1.4–7.7)
Neutrophils Relative %: 62.8 % (ref 43.0–77.0)
Platelets: 386 K/uL (ref 150.0–400.0)
RBC: 4.77 Mil/uL (ref 3.87–5.11)
RDW: 13.7 % (ref 11.5–15.5)
WBC: 5.8 K/uL (ref 4.0–10.5)

## 2023-10-05 LAB — MICROALBUMIN / CREATININE URINE RATIO
Creatinine,U: 62.6 mg/dL
Microalb Creat Ratio: 25.5 mg/g (ref 0.0–30.0)
Microalb, Ur: 1.6 mg/dL (ref 0.0–1.9)

## 2023-10-05 LAB — HEMOGLOBIN A1C: Hgb A1c MFr Bld: 8.7 % — ABNORMAL HIGH (ref 4.6–6.5)

## 2023-10-05 LAB — TSH: TSH: 3.52 u[IU]/mL (ref 0.35–5.50)

## 2023-10-05 LAB — VITAMIN B12: Vitamin B-12: 374 pg/mL (ref 211–911)

## 2023-10-05 MED ORDER — ROSUVASTATIN CALCIUM 5 MG PO TABS: 5.0000 mg | ORAL_TABLET | Freq: Every day | ORAL | 1 refills | Status: DC

## 2023-10-05 MED ORDER — TIRZEPATIDE 2.5 MG/0.5ML ~~LOC~~ SOAJ: 2.5000 mg | SUBCUTANEOUS | 0 refills | Status: DC

## 2023-10-05 MED ORDER — METFORMIN HCL ER 500 MG PO TB24: 1500.0000 mg | ORAL_TABLET | Freq: Every day | ORAL | 1 refills | Status: DC

## 2023-10-05 NOTE — Patient Instructions (Signed)
It was very nice to see you today!  Work on exercise     PLEASE NOTE:  If you had any lab tests please let us know if you have not heard back within a few days. You may see your results on MyChart before we have a chance to review them but we will give you a call once they are reviewed by us. If we ordered any referrals today, please let us know if you have not heard from their office within the next week.   Please try these tips to maintain a healthy lifestyle:  Eat most of your calories during the day when you are active. Eliminate processed foods including packaged sweets (pies, cakes, cookies), reduce intake of potatoes, white bread, white pasta, and white rice. Look for whole grain options, oat flour or almond flour.  Each meal should contain half fruits/vegetables, one quarter protein, and one quarter carbs (no bigger than a computer mouse).  Cut down on sweet beverages. This includes juice, soda, and sweet tea. Also watch fruit intake, though this is a healthier sweet option, it still contains natural sugar! Limit to 3 servings daily.  Drink at least 1 glass of water with each meal and aim for at least 8 glasses per day  Exercise at least 150 minutes every week.   

## 2023-10-05 NOTE — Progress Notes (Signed)
 Subjective:     Patient ID: Cynthia Walters, female    DOB: 01-15-1966, 58 y.o.   MRN: [id number]  Chief Complaint  Patient presents with   Transfer of Care    TOC to new pcp Discuss medications and blood results from March 2025 Fasting     [facility] from [facility].  Last seen 2 yrs ago there Discussed the use of AI scribe software for clinical note transcription with the patient, who gave verbal consent to proceed.  History of Present Illness Cynthia Walters is a 58 year old female with diabetes who presents for management of her diabetes and related health concerns.  She has a history of diabetes and is currently taking metformin  500 mg, three tablets at dinner, which are extended release. Her blood sugar levels have been running in the 160s, and her last A1c in March was 7.6, which she attributes to dietary lapses during the holidays. She does not regularly check her blood sugar at home. She has not been consistent with exercise but is capable of walking significant distances, as evidenced by a recent vacation where she walked 16,000 steps in a day.  She has a history of depression and anxiety and has been on fluoxetine  for two years, which she recently discontinued due to side effects like heat intolerance and questionable efficacy for her depression. The medication helped with anxiety but not significantly with depression. She also experiences tinnitus, which she suspects might be related to fluoxetine  or menopause.  She has a history of hyperlipidemia and was previously prescribed a statin, which she is hesitant to take due to concerns about side effects and dementia risk. Her cholesterol levels have been in the 200s, but her coronary calcium  score was zero as of Aug 03, 2023.  She was diagnosed with fatty liver during a colonoscopy at age 46. Her liver function tests have fluctuated.  She has a history of kidney stones, with her first occurrence two years ago, which she  associates with taking Wegovy . She has not had any further episodes since then.  She is postmenopausal and uses a vasectomy for birth control. She has been taking vitamin D  supplements and has recently acquired a magnesium blend with B12, which she has not yet started taking.  No current headaches, dizziness, chest pain, shortness of breath, or gastrointestinal issues other than those related to metformin  use when on 4 tabs/day.    Health Maintenance Due  Topic Date Due   Hepatitis B Vaccines (1 of 3 - 19+ 3-dose series) Never done   OPHTHALMOLOGY EXAM  10/28/2021   FOOT EXAM  05/26/2022    Past Medical History:  Diagnosis Date   Anxiety    Depression    Depression with anxiety 01/08/2014   Diabetes mellitus without complication (HCC)    Hematuria    History of kidney stones 05/28/2021   Hyperglycemia 03/19/2015   Hyperlipidemia, mixed 11/29/2015   Obesity 11/29/2015   Rosacea    Vitamin B12 deficiency    Vitamin D  deficiency     Past Surgical History:  Procedure Laterality Date   CESAREAN SECTION  2000   DENTAL SURGERY  01/2011   tooth removed,implant   WISDOM TOOTH EXTRACTION  age 3     Current Outpatient Medications:    Cholecalciferol (VITAMIN D -1000 MAX ST) 25 MCG (1000 UT) tablet, Take by mouth., Disp: , Rfl:    estradiol  (VIVELLE -DOT) 0.075 MG/24HR, Apply 1 patch twice a week by transdermal route., Disp: , Rfl:  metFORMIN  (GLUCOPHAGE -XR) 500 MG 24 hr tablet, Take 3 tablets (1,500 mg total) by mouth daily with breakfast., Disp: 270 tablet, Rfl: 1   metroNIDAZOLE (METROGEL) 1 % gel, Apply topically as needed., Disp: , Rfl:    progesterone (PROMETRIUM) 100 MG capsule, Take 2 capsules every day by oral route., Disp: , Rfl:    rosuvastatin  (CRESTOR ) 5 MG tablet, Take 1 tablet (5 mg total) by mouth daily., Disp: 90 tablet, Rfl: 1   tirzepatide  (MOUNJARO ) 2.5 MG/0.5ML Pen, Inject 2.5 mg into the skin once a week., Disp: 2 mL, Rfl: 0  No Known Allergies ROS  neg/noncontributory except as noted HPI/below      Objective:     BP 115/75   Pulse 85   Temp (!) 97.5 F (36.4 C) (Temporal)   Resp 18   Ht 5' 8 (1.727 m)   Wt 216 lb 8 oz (98.2 kg)   LMP 10/31/2018 (Within Weeks)   SpO2 96%   BMI 32.92 kg/m  Wt Readings from Last 3 Encounters:  10/05/23 216 lb 8 oz (98.2 kg)  04/12/23 213 lb (96.6 kg)  10/31/21 195 lb (88.5 kg)    Physical Exam   Gen: WDWN NAD HEENT: NCAT, conjunctiva not injected, sclera nonicteric NECK:  supple, no thyromegaly, no nodes, no carotid bruits CARDIAC: RRR, S1S2+, no murmur. DP 2+B LUNGS: CTAB. No wheezes ABDOMEN:  BS+, soft, NTND, No HSM, no masses EXT:  no edema MSK: no gross abnormalities.  NEURO: A&O x3.  CN II-XII intact.  PSYCH: normal mood. Good eye contact  Reviewed notes/labs from [facility] in March, pt new to me(but seen in [place] <37yrs ago), discussed labs,meds, etc so total    Assessment & Plan:  Type 2 diabetes mellitus without complication, without long-term current use of insulin (HCC) -     Hemoglobin A1c -     Tirzepatide ; Inject 2.5 mg into the skin once a week.  Dispense: 2 mL; Refill: 0 -     CBC with Differential/Platelet -     Comprehensive metabolic panel with GFR -     Microalbumin / creatinine urine ratio -     TSH -     metFORMIN  HCl ER; Take 3 tablets (1,500 mg total) by mouth daily with breakfast.  Dispense: 270 tablet; Refill: 1  Hyperlipidemia, mixed -     Comprehensive metabolic panel with GFR -     Lipid panel -     Rosuvastatin  Calcium ; Take 1 tablet (5 mg total) by mouth daily.  Dispense: 90 tablet; Refill: 1  B12 deficiency -     CBC with Differential/Platelet -     Vitamin B12  Vitamin D  deficiency -     VITAMIN D  25 Hydroxy (Vit-D Deficiency, Fractures)  Long term current use of oral hypoglycemic drug  Nonalcoholic fatty liver  Assessment and Plan Assessment & Plan Type 2 Diabetes Mellitus   Type 2 diabetes mellitus is suboptimally controlled  with a hemoglobin A1c of 7.6% in March, likely due to holiday dietary habits and inconsistent metformin  use. She takes metformin  1500 mg daily but experiences gastrointestinal side effects at higher doses. Blood glucose levels are not regularly monitored. Consider GLP-1 receptor agonists like Ozempic  or Mounjaro  for improved glycemic control and potential weight loss, with a discussion on possible side effects such as gastrointestinal symptoms and rare pancreatitis risk. Emphasize regular exercise for better health and diabetes management. Continue metformin  1500 mg daily, initiate Mounjaro  2.5mg  weekly x 1 mo and if tolerating,  increase to 5mg  weekly, and possibly 7.5mg ,  stress the importance of diet and exercise. Doesn't want a nutritionist  Hyperlipidemia   Hyperlipidemia is present with LDL cholesterol previously around 100 mg/dL and a coronary calcium  score of 0 in May, indicating no current coronary artery disease. Discussed statins' cardiovascular benefits, especially with diabetes, outweighing perceived cognitive decline risks. Alternatives like red yeast rice lack evidence for cardiovascular protection compared to statins. Prescribe rosuvastatin  5 mg daily and monitor liver function tests for potential statin-induced liver enzyme elevation.  Fatty Liver Disease   Fatty liver disease was diagnosed during a colonoscopy at age 55. Liver function tests (ALT and AST) have fluctuated but are currently well-managed. Discussed potential benefits of GLP-1 receptor agonists for fatty liver disease management, though not yet indicated. Order liver function tests regularly.  Depression and Anxiety   Depression and anxiety were previously managed with fluoxetine , which she self-weaned off due to side effects like heat intolerance and questionable efficacy. Reports improved anxiety but continues to experience tinnitus, possibly related to fluoxetine  or menopause. Monitor mental health status and consider  alternative treatments if symptoms recur.  Rosacea   Chronic rosacea was noted, but no specific discussion of current management or symptoms occurred during this visit.  Vitamin D  Deficiency   Vitamin D  deficiency is managed with current supplementation. Discussed the importance of monitoring vitamin D  levels to ensure adequacy. Continue vitamin D  supplementation and monitor levels.    Return in about 3 months (around 01/05/2024) for chronic follow-up.  Jenkins CHRISTELLA Carrel, MD

## 2023-10-06 ENCOUNTER — Encounter: Payer: Self-pay | Admitting: Family Medicine

## 2023-10-06 ENCOUNTER — Other Ambulatory Visit (HOSPITAL_COMMUNITY): Payer: Self-pay

## 2023-10-06 ENCOUNTER — Telehealth: Payer: Self-pay

## 2023-10-06 NOTE — Telephone Encounter (Addendum)
 Pharmacy Patient Advocate Encounter   Received notification from Onbase that prior authorization for Mounjaro  2.5MG /0.5ML auto-injectors is required/requested.   Insurance verification completed.   The patient is insured through [facility] .   Per test claim: The current 28 day co-pay is, $25.00.  No PA needed at this time. This test claim was processed through [facility]- copay amounts may vary at other pharmacies due to pharmacy/plan contracts, or as the patient moves through the different stages of their insurance plan.

## 2023-10-07 ENCOUNTER — Ambulatory Visit: Payer: Self-pay | Admitting: Family Medicine

## 2023-10-07 NOTE — Progress Notes (Signed)
 Cholesterol too high.  As discussed, take the rosuvastatin  Liver tests are elevated.  Of course, work on diet/exercise, getting sugars controlled.  Mounjaro  should help A1C has gotten worse-mounjaro  should help.  Split taking the metformin  so 2 for breakfast and 1 at supper.  More motivation to work on exercise and diet.  Does she want to stick w/above or add more meds as well?

## 2023-10-12 ENCOUNTER — Other Ambulatory Visit: Payer: Self-pay

## 2023-10-15 ENCOUNTER — Other Ambulatory Visit (HOSPITAL_COMMUNITY): Payer: Self-pay

## 2023-11-05 ENCOUNTER — Encounter: Payer: Self-pay | Admitting: Family Medicine

## 2023-11-05 DIAGNOSIS — E119 Type 2 diabetes mellitus without complications: Secondary | ICD-10-CM

## 2023-11-05 NOTE — Telephone Encounter (Signed)
 Last OV 10/05/23 Next OV 01/10/23  Last refill 10/05/23 Qty # 2 mL / 0   Titration up to 5 mg weekly or remain at 2.5 mg weekly?

## 2023-11-08 ENCOUNTER — Other Ambulatory Visit: Payer: Self-pay | Admitting: Family Medicine

## 2023-11-08 ENCOUNTER — Encounter: Payer: Self-pay | Admitting: *Deleted

## 2023-11-08 MED ORDER — TIRZEPATIDE 5 MG/0.5ML ~~LOC~~ SOAJ
5.0000 mg | SUBCUTANEOUS | 0 refills | Status: DC
Start: 2023-11-08 — End: 2023-12-06

## 2023-12-01 DIAGNOSIS — F419 Anxiety disorder, unspecified: Secondary | ICD-10-CM | POA: Diagnosis not present

## 2023-12-01 DIAGNOSIS — N951 Menopausal and female climacteric states: Secondary | ICD-10-CM | POA: Diagnosis not present

## 2023-12-01 DIAGNOSIS — F32A Depression, unspecified: Secondary | ICD-10-CM | POA: Diagnosis not present

## 2023-12-06 ENCOUNTER — Other Ambulatory Visit: Payer: Self-pay | Admitting: Family Medicine

## 2023-12-06 MED ORDER — TIRZEPATIDE 5 MG/0.5ML ~~LOC~~ SOAJ: 5.0000 mg | SUBCUTANEOUS | 0 refills | Status: DC

## 2023-12-07 ENCOUNTER — Other Ambulatory Visit: Payer: Self-pay | Admitting: *Deleted

## 2023-12-07 DIAGNOSIS — E782 Mixed hyperlipidemia: Secondary | ICD-10-CM

## 2023-12-07 DIAGNOSIS — E119 Type 2 diabetes mellitus without complications: Secondary | ICD-10-CM

## 2023-12-28 ENCOUNTER — Ambulatory Visit: Payer: Self-pay

## 2023-12-28 ENCOUNTER — Emergency Department (HOSPITAL_BASED_OUTPATIENT_CLINIC_OR_DEPARTMENT_OTHER)
Admission: EM | Admit: 2023-12-28 | Discharge: 2023-12-28 | Disposition: A | Discharge status: Home / Self Care | Attending: Emergency Medicine | Admitting: Emergency Medicine

## 2023-12-28 ENCOUNTER — Encounter (HOSPITAL_BASED_OUTPATIENT_CLINIC_OR_DEPARTMENT_OTHER): Payer: Self-pay

## 2023-12-28 ENCOUNTER — Other Ambulatory Visit: Payer: Self-pay

## 2023-12-28 DIAGNOSIS — N3001 Acute cystitis with hematuria: Secondary | ICD-10-CM | POA: Insufficient documentation

## 2023-12-28 DIAGNOSIS — E119 Type 2 diabetes mellitus without complications: Secondary | ICD-10-CM | POA: Diagnosis not present

## 2023-12-28 DIAGNOSIS — R197 Diarrhea, unspecified: Secondary | ICD-10-CM | POA: Diagnosis not present

## 2023-12-28 DIAGNOSIS — E860 Dehydration: Secondary | ICD-10-CM | POA: Insufficient documentation

## 2023-12-28 DIAGNOSIS — Z7984 Long term (current) use of oral hypoglycemic drugs: Secondary | ICD-10-CM | POA: Insufficient documentation

## 2023-12-28 LAB — CBC
HCT: 51 % — ABNORMAL HIGH (ref 36.0–46.0)
Hemoglobin: 17.3 g/dL — ABNORMAL HIGH (ref 12.0–15.0)
MCH: 29.8 pg (ref 26.0–34.0)
MCHC: 33.9 g/dL (ref 30.0–36.0)
MCV: 87.8 fL (ref 80.0–100.0)
Platelets: 509 K/uL — ABNORMAL HIGH (ref 150–400)
RBC: 5.81 MIL/uL — ABNORMAL HIGH (ref 3.87–5.11)
RDW: 13.1 % (ref 11.5–15.5)
WBC: 7.6 K/uL (ref 4.0–10.5)
nRBC: 0 % (ref 0.0–0.2)

## 2023-12-28 LAB — COMPREHENSIVE METABOLIC PANEL WITH GFR
ALT: 59 U/L — ABNORMAL HIGH (ref 0–44)
AST: 38 U/L (ref 15–41)
Albumin: 4.9 g/dL (ref 3.5–5.0)
Alkaline Phosphatase: 97 U/L (ref 38–126)
Anion gap: 17 — ABNORMAL HIGH (ref 5–15)
BUN: 14 mg/dL (ref 6–20)
CO2: 15 mmol/L — ABNORMAL LOW (ref 22–32)
Calcium: 9.4 mg/dL (ref 8.9–10.3)
Chloride: 102 mmol/L (ref 98–111)
Creatinine, Ser: 0.94 mg/dL (ref 0.44–1.00)
GFR, Estimated: >60 mL/min (ref >60)
Glucose, Bld: 191 mg/dL — ABNORMAL HIGH (ref 70–99)
Potassium: 4.3 mmol/L (ref 3.5–5.1)
Sodium: 134 mmol/L — ABNORMAL LOW (ref 135–145)
Total Bilirubin: 0.9 mg/dL (ref 0.0–1.2)
Total Protein: 8.9 g/dL — ABNORMAL HIGH (ref 6.5–8.1)

## 2023-12-28 LAB — URINALYSIS, ROUTINE W REFLEX MICROSCOPIC
Glucose, UA: 100 mg/dL — AB
Hgb urine dipstick: NEGATIVE
Ketones, ur: NEGATIVE mg/dL
Nitrite: POSITIVE — AB
Protein, ur: >=300 mg/dL — AB
Specific Gravity, Urine: >=1.03 (ref 1.005–1.030)
pH: 5.5 (ref 5.0–8.0)

## 2023-12-28 LAB — LIPASE, BLOOD: Lipase: 25 U/L (ref 11–51)

## 2023-12-28 LAB — CBG MONITORING, ED: Glucose-Capillary: 210 mg/dL — ABNORMAL HIGH (ref 70–99)

## 2023-12-28 LAB — URINALYSIS, MICROSCOPIC (REFLEX)

## 2023-12-28 LAB — MAGNESIUM: Magnesium: 1.9 mg/dL (ref 1.7–2.4)

## 2023-12-28 MED ORDER — SODIUM CHLORIDE 0.9 % IV SOLN
1.0000 g | Freq: Once | INTRAVENOUS | Status: AC
  Administered 2023-12-28: 1 g via INTRAVENOUS
  Filled 2023-12-28: qty 10

## 2023-12-28 MED ORDER — ONDANSETRON HCL 4 MG/2ML IJ SOLN
4.0000 mg | Freq: Once | INTRAMUSCULAR | Status: AC | PRN
Start: 2023-12-28 — End: 2023-12-28
  Administered 2023-12-28: 4 mg via INTRAVENOUS
  Filled 2023-12-28: qty 2

## 2023-12-28 MED ORDER — SODIUM CHLORIDE 0.9 % IV BOLUS
1000.0000 mL | Freq: Once | INTRAVENOUS | Status: AC
  Administered 2023-12-28: 1000 mL via INTRAVENOUS

## 2023-12-28 MED ORDER — CEFPODOXIME PROXETIL 200 MG PO TABS: 200.0000 mg | ORAL_TABLET | Freq: Two times a day (BID) | ORAL | 0 refills | Status: DC

## 2023-12-28 NOTE — ED Triage Notes (Signed)
 Diarrhea since Sunday night. Denies abd pain, N/V  Reports LOC last night while using bathroom, dizziness after coming to. Denies head injury, current dizziness.   Denies chest pain, SHOB, blood thinners.

## 2023-12-28 NOTE — ED Provider Notes (Cosign Needed Addendum)
 [facility] Provider Note   CSN: [id number] Arrival date & time: 12/28/23  1053     Patient presents with: Diarrhea and Loss of Consciousness   Cynthia Walters is a 58 y.o. female patient who presents to the emergency department today for further evaluation of intractable diarrhea and 1 episode of loss of consciousness which occurred around 3 AM.  Patient states that diarrhea started Sunday night and she was going to the bathroom approximately every hour.  She states that yesterday she had some toast and was able to eat some noodles and it did subside somewhat.  However, diarrhea started again and is now intractable.  Patient states that she felt dizzy and ended up losing conscious while using the toilet.  She was not straining.  She denies any chest pain or shortness of breath with this.  Patient has not been able to have a ton of p.o. intake.  She endorsed associated nausea and some abdominal cramping prior to diarrhea but denies fever, chills, urinary symptoms.   Diarrhea Loss of Consciousness      Prior to Admission medications   Medication Sig Start Date End Date Taking? Authorizing Provider  cefpodoxime (VANTIN) 200 MG tablet Take 1 tablet (200 mg total) by mouth 2 (two) times daily. 12/28/23  Yes Graci Hulce M, PA-C  Cholecalciferol (VITAMIN D -1000 MAX ST) 25 MCG (1000 UT) tablet Take by mouth.    [provider]  estradiol  (VIVELLE -DOT) 0.075 MG/24HR Apply 1 patch twice a week by transdermal route. 09/01/23   [provider]  metFORMIN  (GLUCOPHAGE -XR) 500 MG 24 hr tablet Take 3 tablets (1,500 mg total) by mouth daily with breakfast. 10/05/23   Wendolyn Jenkins Jansky, MD  metroNIDAZOLE (METROGEL) 1 % gel Apply topically as needed. 11/24/21   [provider]  progesterone (PROMETRIUM) 100 MG capsule Take 2 capsules every day by oral route. 07/02/23   [provider]  rosuvastatin  (CRESTOR ) 5 MG tablet  Take 1 tablet (5 mg total) by mouth daily. 10/05/23   Wendolyn Jenkins Jansky, MD  tirzepatide  (MOUNJARO ) 5 MG/0.5ML Pen Inject 5 mg into the skin once a week. 12/06/23   Wendolyn Jenkins Jansky, MD    Allergies: Patient has no known allergies.    Review of Systems  Cardiovascular:  Positive for syncope.  Gastrointestinal:  Positive for diarrhea.  All other systems reviewed and are negative.   Updated Vital Signs BP 116/61 (BP Location: Left Arm)   Pulse 99   Temp 98 F (36.7 C) (Oral)   Resp 18   LMP 10/31/2018 (Within Weeks)   SpO2 98%   Physical Exam Vitals and nursing note reviewed.  Constitutional:      General: She is not in acute distress.    Appearance: Normal appearance.  HENT:     Head: Normocephalic and atraumatic.  Eyes:     General:        Right eye: No discharge.        Left eye: No discharge.  Cardiovascular:     Comments: Tachycardic. S1/S2 are distinct without any evidence of murmur, rubs, or gallops.  Radial pulses are 2+ bilaterally.  Dorsalis pedis pulses are 2+ bilaterally.  No evidence of pedal edema. Pulmonary:     Comments: Clear to auscultation bilaterally.  Normal effort.  No respiratory distress.  No evidence of wheezes, rales, or rhonchi heard throughout. Abdominal:     General: Abdomen is flat. Bowel sounds are normal. There is no  distension.     Tenderness: There is no abdominal tenderness. There is no guarding or rebound.  Musculoskeletal:        General: Normal range of motion.     Cervical back: Neck supple.  Skin:    General: Skin is warm and dry.     Findings: No rash.  Neurological:     General: No focal deficit present.     Mental Status: She is alert.  Psychiatric:        Mood and Affect: Mood normal.        Behavior: Behavior normal.     (all labs ordered are listed, but only abnormal results are displayed) Labs Reviewed  COMPREHENSIVE METABOLIC PANEL WITH GFR - Abnormal; Notable for the following components:      Result Value   Sodium  134 (*)    CO2 15 (*)    Glucose, Bld 191 (*)    Total Protein 8.9 (*)    ALT 59 (*)    Anion gap 17 (*)    All other components within normal limits  CBC - Abnormal; Notable for the following components:   RBC 5.81 (*)    Hemoglobin 17.3 (*)    HCT 51.0 (*)    Platelets 509 (*)    All other components within normal limits  URINALYSIS, ROUTINE W REFLEX MICROSCOPIC - Abnormal; Notable for the following components:   APPearance TURBID (*)    Glucose, UA 100 (*)    Bilirubin Urine MODERATE (*)    Protein, ur >=300 (*)    Nitrite POSITIVE (*)    Leukocytes,Ua TRACE (*)    All other components within normal limits  URINALYSIS, MICROSCOPIC (REFLEX) - Abnormal; Notable for the following components:   Bacteria, UA MANY (*)    All other components within normal limits  CBG MONITORING, ED - Abnormal; Notable for the following components:   Glucose-Capillary 210 (*)    All other components within normal limits  STOOL CULTURE  LIPASE, BLOOD  MAGNESIUM    EKG: None  Radiology: No results found.   Procedures   Medications Ordered in the ED  ondansetron  (ZOFRAN ) injection 4 mg (4 mg Intravenous Given 12/28/23 1117)  sodium chloride  0.9 % bolus 1,000 mL (0 mLs Intravenous Stopped 12/28/23 1359)  cefTRIAXone (ROCEPHIN) 1 g in sodium chloride  0.9 % 100 mL IVPB (0 g Intravenous Stopped 12/28/23 1359)  sodium chloride  0.9 % bolus 1,000 mL (1,000 mLs Intravenous New Bag/Given 12/28/23 1437)    Clinical Course as of 12/28/23 1540  Tue Dec 28, 2023  1329 I went over labs with patient at the bedside.  Patient is not especially having some urinary symptoms apart from some urinary urgency but is question whether or not this started before the diarrhea started.  Will plan to give the patient 1 g of ceftriaxone to treat both the urinary tract infection and for any possible bacterial infection that might be going on the abdomen. [CF]  1432 Urinalysis, Routine w reflex microscopic -Urine, Clean  Catch(!) Positive for UTI.  [CF]  1534 No evidence of leukocytosis. [CF]  1535 Comprehensive metabolic panel(!) Mild hyponatremia. No other significant abnormalities. [CF]  1536 Magnesium Negative.  [CF]  1536 Lipase, blood Negative.  [CF]    Clinical Course User Index [CF] Theotis Cameron HERO, PA-C    Medical Decision Making Cynthia Walters is a 58 y.o. female patient who presents to the emergency department today for further evaluation of diarrhea.  I suspect the  loss of conscious is likely due to dehydration and overall volume loss.  Low suspicion for any cardiac or neurologic etiologies.  Will plan to check electrolytes.  I will also give the patient some fluids.  Will plan to reassess when some of the labs and imaging results.  Patient feeling much better after 2 L of fluid. Tachycardia resolved. There was some evidence of urinary tract infection although there was quite a bit of squamous epithelial cells nitrites were positive with leukocytes.  I treated her with 1 g of ceftriaxone here which should cover for any bacterial infection intra-abdominal he is well.  I will continue to treat with cefpodoxime for 10 days given the patient does have diabetes.  I will have her follow-up with her primary care physician for further evaluation.  We discussed adequate fluid hydration and foods to help bulk up the stool.  Strict turn precautions were discussed at length.  All question concerns addressed.  She is safe for discharge.  Amount and/or Complexity of Data Reviewed Labs: ordered. Decision-making details documented in ED Course.  Risk Prescription drug management.     Final diagnoses:  Diarrhea, unspecified type  Dehydration  Acute cystitis with hematuria    ED Discharge Orders          Ordered    cefpodoxime (VANTIN) 200 MG tablet  2 times daily        12/28/23 1538               Theotis Peers [place], [place] 12/28/23 1540    [address] [place], [place] 12/28/23  1541    Jerrol Agent, MD 12/29/23 [phone]

## 2023-12-28 NOTE — ED Notes (Signed)
 Pt unable to provide stool sample, will try again Shortly

## 2023-12-28 NOTE — Telephone Encounter (Signed)
 Summary: diarrhea   Reason for Triage: severe diarrhea        FYI Only or Action Required?: FYI only for provider.  Patient was last seen in primary care on 10/05/2023 by Wendolyn Jenkins Jansky, MD.  Called Nurse Triage reporting Diarrhea.  Symptoms began yesterday.  Interventions attempted: Nothing.  Symptoms are: gradually worsening.  Triage Disposition: Go to ED Now (or PCP Triage)  Patient/caregiver understands and will follow disposition?: Yes Reason for Disposition  [1] Drinking very little AND [2] dehydration suspected (e.g., no urine > 12 hours, very dry mouth, very lightheaded)  Answer Assessment - Initial Assessment Questions 1. DIARRHEA SEVERITY: How bad is the diarrhea? How many more stools have you had in the past 24 hours than normal?      States goes every two hours 2. ONSET: When did the diarrhea begin?      Sunday night, states last night passed out on toilet 3. STOOL DESCRIPTION:  How loose or watery is the diarrhea? What is the stool color? Is there any blood or mucous in the stool?     water 4. VOMITING: Are you also vomiting? If Yes, ask: How many times in the past 24 hours?      nausea 5. ABDOMEN PAIN: Are you having any abdomen pain? If Yes, ask: What does it feel like? (e.g., crampy, dull, intermittent, constant)      Crampy, hears gurgling 6. ABDOMEN PAIN SEVERITY: If present, ask: How bad is the pain?  (e.g., Scale 1-10; mild, moderate, or severe)     mild 7. ORAL INTAKE: If vomiting, Have you been able to drink liquids? How much liquids have you had in the past 24 hours?     Denies vomiting 8. HYDRATION: Any signs of dehydration? (e.g., dry mouth [not just dry lips], too weak to stand, dizziness, new weight loss) When did you last urinate?     Dry mouth, too weak to stand, dizzy 9. EXPOSURE: Have you traveled to a foreign country recently? Have you been exposed to anyone with diarrhea? Could you have eaten any food that was  spoiled?     denies 10. ANTIBIOTIC USE: Are you taking antibiotics now or have you taken antibiotics in the past 2 months?       denies 11. OTHER SYMPTOMS: Do you have any other symptoms? (e.g., fever, blood in stool)       denies 12. PREGNANCY: Is there any chance you are pregnant? When was your last menstrual period?       na  Protocols used: [facility]

## 2023-12-28 NOTE — Discharge Instructions (Addendum)
 Please take antibiotics as prescribed for presumed urinary tract infection.  I would like for you to follow-up with your primary care doctor for further evaluation.  Continue eating rice, applesauce, bananas, toast.  You can elevate your diet as tolerated.  Speak to your primary care physician about consuming your Mounjaro .  You may return to the emergency department for any worsening symptoms.

## 2023-12-29 ENCOUNTER — Encounter: Payer: Self-pay | Admitting: Family Medicine

## 2023-12-29 DIAGNOSIS — L814 Other melanin hyperpigmentation: Secondary | ICD-10-CM | POA: Diagnosis not present

## 2023-12-29 DIAGNOSIS — L918 Other hypertrophic disorders of the skin: Secondary | ICD-10-CM | POA: Diagnosis not present

## 2023-12-29 DIAGNOSIS — D225 Melanocytic nevi of trunk: Secondary | ICD-10-CM | POA: Diagnosis not present

## 2024-01-03 ENCOUNTER — Other Ambulatory Visit (INDEPENDENT_AMBULATORY_CARE_PROVIDER_SITE_OTHER)

## 2024-01-03 ENCOUNTER — Ambulatory Visit: Payer: Self-pay | Admitting: Family

## 2024-01-03 DIAGNOSIS — E782 Mixed hyperlipidemia: Secondary | ICD-10-CM

## 2024-01-03 DIAGNOSIS — E119 Type 2 diabetes mellitus without complications: Secondary | ICD-10-CM | POA: Diagnosis not present

## 2024-01-03 LAB — COMPREHENSIVE METABOLIC PANEL WITH GFR
ALT: 27 U/L (ref 0–35)
AST: 25 U/L (ref 0–37)
Albumin: 4.1 g/dL (ref 3.5–5.2)
Alkaline Phosphatase: 71 U/L (ref 39–117)
BUN: 9 mg/dL (ref 6–23)
CO2: 24 meq/L (ref 19–32)
Calcium: 9.2 mg/dL (ref 8.4–10.5)
Chloride: 105 meq/L (ref 96–112)
Creatinine, Ser: 0.63 mg/dL (ref 0.40–1.20)
GFR: 99.13 mL/min (ref >60.00)
Glucose, Bld: 137 mg/dL — ABNORMAL HIGH (ref 70–99)
Potassium: 3.9 meq/L (ref 3.5–5.1)
Sodium: 141 meq/L (ref 135–145)
Total Bilirubin: 0.4 mg/dL (ref 0.2–1.2)
Total Protein: 7 g/dL (ref 6.0–8.3)

## 2024-01-03 LAB — LIPID PANEL
Cholesterol: 150 mg/dL (ref 0–200)
HDL: 40.2 mg/dL (ref >39.00)
LDL Cholesterol: 87 mg/dL (ref 0–99)
NonHDL: 109.42
Total CHOL/HDL Ratio: 4
Triglycerides: 111 mg/dL (ref 0.0–149.0)
VLDL: 22.2 mg/dL (ref 0.0–40.0)

## 2024-01-03 LAB — HEMOGLOBIN A1C: Hgb A1c MFr Bld: 6.7 % — ABNORMAL HIGH (ref 4.6–6.5)

## 2024-01-10 ENCOUNTER — Encounter: Payer: Self-pay | Admitting: Family Medicine

## 2024-01-10 ENCOUNTER — Ambulatory Visit: Admitting: Family Medicine

## 2024-01-10 VITALS — BP 130/76 | HR 88 | Temp 97.7°F | Ht 68.0 in | Wt 199.0 lb

## 2024-01-10 DIAGNOSIS — E559 Vitamin D deficiency, unspecified: Secondary | ICD-10-CM

## 2024-01-10 DIAGNOSIS — Z7984 Long term (current) use of oral hypoglycemic drugs: Secondary | ICD-10-CM | POA: Diagnosis not present

## 2024-01-10 DIAGNOSIS — E119 Type 2 diabetes mellitus without complications: Secondary | ICD-10-CM

## 2024-01-10 DIAGNOSIS — Z23 Encounter for immunization: Secondary | ICD-10-CM | POA: Diagnosis not present

## 2024-01-10 DIAGNOSIS — F418 Other specified anxiety disorders: Secondary | ICD-10-CM

## 2024-01-10 DIAGNOSIS — Z7985 Long-term (current) use of injectable non-insulin antidiabetic drugs: Secondary | ICD-10-CM | POA: Diagnosis not present

## 2024-01-10 MED ORDER — TIRZEPATIDE 5 MG/0.5ML ~~LOC~~ SOAJ: 5.0000 mg | SUBCUTANEOUS | 1 refills | Status: AC

## 2024-01-10 NOTE — Patient Instructions (Signed)
It was very nice to see you today!  Great job   PLEASE NOTE:  If you had any lab tests please let us know if you have not heard back within a few days. You may see your results on MyChart before we have a chance to review them but we will give you a call once they are reviewed by [place]. If we ordered any referrals today, please let us know if you have not heard from their office within the next week.   Please try these tips to maintain a healthy lifestyle:  Eat most of your calories during the day when you are active. Eliminate processed foods including packaged sweets (pies, cakes, cookies), reduce intake of potatoes, white bread, white pasta, and white rice. Look for whole grain options, oat flour or almond flour.  Each meal should contain half fruits/vegetables, one quarter protein, and one quarter carbs (no bigger than a computer mouse).  Cut down on sweet beverages. This includes juice, soda, and sweet tea. Also watch fruit intake, though this is a healthier sweet option, it still contains natural sugar! Limit to 3 servings daily.  Drink at least 1 glass of water with each meal and aim for at least 8 glasses per day  Exercise at least 150 minutes every week.

## 2024-01-10 NOTE — Progress Notes (Signed)
 Subjective:     Patient ID: Cynthia Walters, female    DOB: May 05, 1965, 58 y.o.   MRN: [id number]  Chief Complaint  Patient presents with   Diabetes    69mo follow up; no concerns    HPI Discussed the use of AI scribe software for clinical note transcription with the patient, who gave verbal consent to proceed.  History of Present Illness Cynthia Walters is a 58 year old female with diabetes who presents for follow-up regarding her diabetes management.  She has been managing her diabetes with Mounjaro , taking 5 mg once a week, resulting in a weight loss of 17 pounds. Initially, she experienced malaise and anorexia, which resolved over time. Two weeks ago, she had an episode of either food poisoning or a stomach virus, leading to dehydration and an emergency room visit for IV fluids. She wondered if Mounjaro  may have slowed her recovery, but was not sure. Her bowel movements have improved since starting Mounjaro , as she previously experienced diarrhea with metformin . She currently takes metformin  1000 mg with dinner but struggles to remember the 500 mg dose at lunch. She feels she could do better with her A1c if she could remember to take the additional 500 mg dose of metformin  at lunch. She has not been checking her blood sugars regularly.  She recently completed a course of antibiotics for a urinary tract infection, experiencing urgency and a weak stream without dysuria.  She has a history of depression and started taking the generic for Pristiq, 50 mg, in September, which has improved her mood and focus. She experiences low energy and difficulty focusing, and has wondered if these symptoms could be related to menopause or Mounjaro . She has been on hormone therapy since April.  Her recent labs showed LDL was 87 and A1c was 6.7. She has not been taking rosuvastatin .  She does not eat breakfast regularly and has difficulty planning meals, often eating out. She reports changes  in taste and reduced appetite, especially at dinner, but sometimes feels hungry later in the evening. She is not currently exercising but acknowledges the benefits and plans to start.  She has not been taking B12. She is taking vitamin D  and has an upcoming eye exam and mammogram scheduled. She received a flu shot today but has not had a pneumonia shot.  She is working on increasing her water intake, aiming to drink more consistently throughout the day.    Health Maintenance Due  Topic Date Due   Pneumococcal Vaccine: 50+ Years (1 of 2 - PCV) Never done   Hepatitis B Vaccines 19-59 Average Risk (1 of 3 - 19+ 3-dose series) Never done   OPHTHALMOLOGY EXAM  10/28/2021   FOOT EXAM  05/26/2022   COVID-19 Vaccine (9 - 2025-26 season) 11/29/2023   Mammogram  01/12/2024    Past Medical History:  Diagnosis Date   Anxiety    Depression    Depression with anxiety 01/08/2014   Diabetes mellitus without complication (HCC)    Hematuria    History of kidney stones 05/28/2021   Hyperglycemia 03/19/2015   Hyperlipidemia, mixed 11/29/2015   Obesity 11/29/2015   Rosacea    Vitamin B12 deficiency    Vitamin D  deficiency     Past Surgical History:  Procedure Laterality Date   CESAREAN SECTION  2000   DENTAL SURGERY  01/2011   tooth removed,implant   WISDOM TOOTH EXTRACTION  age 84     Current Outpatient Medications:    Cholecalciferol (  VITAMIN D -1000 MAX ST) 25 MCG (1000 UT) tablet, Take by mouth., Disp: , Rfl:    desvenlafaxine (PRISTIQ) 50 MG 24 hr tablet, Take 50 mg by mouth daily., Disp: , Rfl:    estradiol  (VIVELLE -DOT) 0.075 MG/24HR, Apply 1 patch twice a week by transdermal route., Disp: , Rfl:    metFORMIN  (GLUCOPHAGE -XR) 500 MG 24 hr tablet, Take 3 tablets (1,500 mg total) by mouth daily with breakfast., Disp: 270 tablet, Rfl: 1   metroNIDAZOLE (METROGEL) 1 % gel, Apply topically as needed., Disp: , Rfl:    progesterone (PROMETRIUM) 100 MG capsule, Take 2 capsules every day by  oral route., Disp: , Rfl:    tirzepatide  (MOUNJARO ) 5 MG/0.5ML Pen, Inject 5 mg into the skin once a week., Disp: 6 mL, Rfl: 1  No Known Allergies ROS neg/noncontributory except as noted HPI/below      Objective:     BP 130/76   Pulse 88   Temp 97.7 F (36.5 C)   Ht 5' 8 (1.727 m)   Wt 199 lb (90.3 kg)   LMP 10/31/2018 (Within Weeks)   SpO2 96%   BMI 30.26 kg/m  Wt Readings from Last 3 Encounters:  01/10/24 199 lb (90.3 kg)  10/05/23 216 lb 8 oz (98.2 kg)  04/12/23 213 lb (96.6 kg)    Physical Exam   Gen: WDWN NAD HEENT: NCAT, conjunctiva not injected, sclera nonicteric NECK:  supple, no thyromegaly, no nodes, no carotid bruits CARDIAC: RRR, S1S2+, no murmur. DP 2+B LUNGS: CTAB. No wheezes ABDOMEN:  BS+, soft, NTND, No HSM, no masses EXT:  no edema MSK: no gross abnormalities.  NEURO: A&O x3.  CN II-XII intact.  PSYCH: normal mood. Good eye contact     Assessment & Plan:  Type 2 diabetes mellitus without complication, without long-term current use of insulin (HCC) -     Tirzepatide ; Inject 5 mg into the skin once a week.  Dispense: 6 mL; Refill: 1  Vitamin D  deficiency  Depression with anxiety  Flu vaccine need -     Flu vaccine trivalent PF, 6mos and older(Flulaval,Afluria,Fluarix,Fluzone)  Long term current use of oral hypoglycemic drug  Long-term current use of injectable noninsulin antidiabetic medication  Assessment and Plan Assessment & Plan Type 2 diabetes mellitus   Type 2 diabetes mellitus is managed with Mounjaro  5 mg weekly and metformin  1000 mg with dinner. She has lost 17 pounds, reports improved bowel movements, and her A1c has decreased to 6.7. An episode of dehydration due to possible food poisoning or a stomach virus required an ER visit for IV fluids. She skipped a dose of Mounjaro  during this illness but resumed the following week. Consider discontinuing the 500 mg metformin  dose at lunch due to Mounjaro 's effectiveness. Continue Mounjaro   5 mg weekly and metformin  1000 mg with dinner. Send a 59-month supply of Mounjaro  to [employer]. Encourage regular exercise for weight management and overall health, and ensure adequate hydration.  Major depressive disorder   Major depressive disorder is managed with desvenlafaxine (Pristiq) 50 mg daily, prescribed by Dr. Mat. She reports improvement in depressive symptoms and focus at work. The potential impact of Mounjaro  on mood is discussed, but its effect on underlying depression is unclear. Continue desvenlafaxine (Pristiq) 50 mg daily. Encourage regular exercise to improve mood and energy levels.  Hyperlipidemia   Hyperlipidemia is not currently treated with rosuvastatin  as LDL has decreased to 87 with Mounjaro . Rosuvastatin  has been discontinued.  Vit D def-on D Advised to add B12  Return in about 3 months (around 04/11/2024) for DM.  Jenkins CHRISTELLA Carrel, MD

## 2024-02-14 DIAGNOSIS — H2513 Age-related nuclear cataract, bilateral: Secondary | ICD-10-CM | POA: Diagnosis not present

## 2024-02-14 DIAGNOSIS — Z1231 Encounter for screening mammogram for malignant neoplasm of breast: Secondary | ICD-10-CM | POA: Diagnosis not present

## 2024-02-14 DIAGNOSIS — H04123 Dry eye syndrome of bilateral lacrimal glands: Secondary | ICD-10-CM | POA: Diagnosis not present

## 2024-02-14 DIAGNOSIS — H0288A Meibomian gland dysfunction right eye, upper and lower eyelids: Secondary | ICD-10-CM | POA: Diagnosis not present

## 2024-02-14 DIAGNOSIS — E119 Type 2 diabetes mellitus without complications: Secondary | ICD-10-CM | POA: Diagnosis not present

## 2024-02-14 LAB — OPHTHALMOLOGY REPORT-SCANNED

## 2024-02-14 LAB — HM MAMMOGRAPHY

## 2024-02-15 ENCOUNTER — Ambulatory Visit: Payer: Self-pay | Admitting: Obstetrics and Gynecology

## 2024-02-15 ENCOUNTER — Encounter: Payer: Self-pay | Admitting: Obstetrics and Gynecology

## 2024-02-17 DIAGNOSIS — N951 Menopausal and female climacteric states: Secondary | ICD-10-CM | POA: Diagnosis not present

## 2024-02-17 DIAGNOSIS — F32A Depression, unspecified: Secondary | ICD-10-CM | POA: Diagnosis not present

## 2024-02-17 DIAGNOSIS — F419 Anxiety disorder, unspecified: Secondary | ICD-10-CM | POA: Diagnosis not present

## 2024-03-16 ENCOUNTER — Encounter: Payer: Self-pay | Admitting: Family Medicine

## 2024-03-27 ENCOUNTER — Other Ambulatory Visit: Payer: Self-pay

## 2024-03-27 ENCOUNTER — Other Ambulatory Visit: Payer: Self-pay | Admitting: Family Medicine

## 2024-03-27 DIAGNOSIS — E119 Type 2 diabetes mellitus without complications: Secondary | ICD-10-CM

## 2024-03-27 MED ORDER — METFORMIN HCL ER 500 MG PO TB24: 1500.0000 mg | ORAL_TABLET | Freq: Every day | ORAL | 1 refills | Status: AC

## 2024-03-27 NOTE — Telephone Encounter (Signed)
 Rx sent in

## 2024-04-04 ENCOUNTER — Telehealth: Payer: Self-pay

## 2024-04-04 DIAGNOSIS — E119 Type 2 diabetes mellitus without complications: Secondary | ICD-10-CM

## 2024-04-04 NOTE — Telephone Encounter (Signed)
 What labs need to be done   Copied from CRM #[id number]. Topic: Clinical - Request for Lab/Test Order >> Apr 03, 2024  2:12 PM Robinson H wrote: Reason for CRM: Patient is calling to schedule labs before her appointment on 1/19 for a 3 month follow up with Dr. Wendolyn.  [place] [phone]

## 2024-04-10 ENCOUNTER — Other Ambulatory Visit

## 2024-04-10 DIAGNOSIS — E119 Type 2 diabetes mellitus without complications: Secondary | ICD-10-CM | POA: Diagnosis not present

## 2024-04-10 LAB — CBC WITH DIFFERENTIAL/PLATELET
Basophils Absolute: 0 K/uL (ref 0.0–0.1)
Basophils Relative: 0.7 % (ref 0.0–3.0)
Eosinophils Absolute: 0.6 K/uL (ref 0.0–0.7)
Eosinophils Relative: 9.1 % — ABNORMAL HIGH (ref 0.0–5.0)
HCT: 40.9 % (ref 36.0–46.0)
Hemoglobin: 14 g/dL (ref 12.0–15.0)
Lymphocytes Relative: 18 % (ref 12.0–46.0)
Lymphs Abs: 1.2 K/uL (ref 0.7–4.0)
MCHC: 34.2 g/dL (ref 30.0–36.0)
MCV: 88.6 fl (ref 78.0–100.0)
Monocytes Absolute: 0.4 K/uL (ref 0.1–1.0)
Monocytes Relative: 6.9 % (ref 3.0–12.0)
Neutro Abs: 4.2 K/uL (ref 1.4–7.7)
Neutrophils Relative %: 65.3 % (ref 43.0–77.0)
Platelets: 399 K/uL (ref 150.0–400.0)
RBC: 4.62 Mil/uL (ref 3.87–5.11)
RDW: 13.7 % (ref 11.5–15.5)
WBC: 6.5 K/uL (ref 4.0–10.5)

## 2024-04-11 ENCOUNTER — Ambulatory Visit: Payer: Self-pay | Admitting: Family Medicine

## 2024-04-11 LAB — COMPLETE METABOLIC PANEL WITHOUT GFR
AG Ratio: 1.5 (calc) (ref 1.0–2.5)
ALT: 19 U/L (ref 6–29)
AST: 17 U/L (ref 10–35)
Albumin: 4.2 g/dL (ref 3.6–5.1)
Alkaline phosphatase (APISO): 80 U/L (ref 37–153)
BUN: 10 mg/dL (ref 7–25)
CO2: 26 mmol/L (ref 20–32)
Calcium: 9.2 mg/dL (ref 8.6–10.4)
Chloride: 105 mmol/L (ref 98–110)
Creat: 0.6 mg/dL (ref 0.50–1.03)
Globulin: 2.8 g/dL (ref 1.9–3.7)
Glucose, Bld: 112 mg/dL — ABNORMAL HIGH (ref 65–99)
Potassium: 4.6 mmol/L (ref 3.5–5.3)
Sodium: 138 mmol/L (ref 135–146)
Total Bilirubin: 0.5 mg/dL (ref 0.2–1.2)
Total Protein: 7 g/dL (ref 6.1–8.1)

## 2024-04-11 LAB — HEMOGLOBIN A1C
Hgb A1c MFr Bld: 5.8 % — ABNORMAL HIGH
Mean Plasma Glucose: 120 mg/dL
eAG (mmol/L): 6.6 mmol/L

## 2024-04-11 NOTE — Progress Notes (Signed)
 Great.  Will discuss more at appt on 1/19

## 2024-04-11 NOTE — Progress Notes (Signed)
Pt has read results.

## 2024-04-17 ENCOUNTER — Ambulatory Visit: Admitting: Family Medicine

## 2024-04-17 ENCOUNTER — Encounter: Payer: Self-pay | Admitting: Family Medicine

## 2024-04-17 VITALS — BP 132/86 | HR 82 | Temp 97.2°F | Ht 68.0 in | Wt 188.1 lb

## 2024-04-17 DIAGNOSIS — E119 Type 2 diabetes mellitus without complications: Secondary | ICD-10-CM | POA: Diagnosis not present

## 2024-04-17 DIAGNOSIS — E538 Deficiency of other specified B group vitamins: Secondary | ICD-10-CM

## 2024-04-17 DIAGNOSIS — Z7985 Long-term (current) use of injectable non-insulin antidiabetic drugs: Secondary | ICD-10-CM | POA: Diagnosis not present

## 2024-04-17 DIAGNOSIS — E559 Vitamin D deficiency, unspecified: Secondary | ICD-10-CM | POA: Diagnosis not present

## 2024-04-17 DIAGNOSIS — Z7984 Long term (current) use of oral hypoglycemic drugs: Secondary | ICD-10-CM

## 2024-04-17 MED ORDER — ONDANSETRON 4 MG PO TBDP: 4.0000 mg | ORAL_TABLET | Freq: Three times a day (TID) | ORAL | 1 refills | Status: AC | PRN

## 2024-04-17 NOTE — Progress Notes (Signed)
 "  Subjective:     Patient ID: Cynthia Walters, female    DOB: 09/20/1965, 59 y.o.   MRN: [id number]  Chief Complaint  Patient presents with   Diabetes    Pt is here for chronic issues    Discussed the use of AI scribe software for clinical note transcription with the patient, who gave verbal consent to proceed.  History of Present Illness Cynthia Walters is a 59 year old female with diabetes who presents for a follow-up visit.  She is currently on Mounjaro  5 mg and metformin  1000 mg once daily. Her most recent A1c is 5.8%. She has lost 11 pounds since her last visit and 29 pounds since July 8th.  has experienced gastrointestinal issues when on metformin  alone.  The two together balance GI SE.  She is not engaging in regular exercise but occasionally goes for walks when the weather permits. She wants to incorporate more physical activity into her routine, such as using a rebounder.  She is experiencing symptoms of depression, including feelings of hopelessness and low energy. She believes that increasing her physical activity could help alleviate these symptoms. She is currently taking Pristiq for depression and estrogen and progesterone for menopause symptoms.  She reports hair loss. She is concerned about her calcium  and vitamin D  intake.  She experiences occasional nausea, described as similar to car sickness. She has used Zofran  in the past for nausea relief. No chest pain, shortness of breath, or suicidal thoughts.    Health Maintenance Due  Topic Date Due   FOOT EXAM  05/26/2022   Diabetic kidney evaluation - Urine ACR  04/06/2024    Past Medical History:  Diagnosis Date   Anxiety    Depression    Depression with anxiety 01/08/2014   Diabetes mellitus without complication (HCC)    Hematuria    History of kidney stones 05/28/2021   Hyperglycemia 03/19/2015   Hyperlipidemia, mixed 11/29/2015   Obesity 11/29/2015   Rosacea    Vitamin B12 deficiency     Vitamin D  deficiency     Past Surgical History:  Procedure Laterality Date   CESAREAN SECTION  2000   DENTAL SURGERY  01/2011   tooth removed,implant   WISDOM TOOTH EXTRACTION  age 54    Current Medications[1]  Allergies[2] ROS neg/noncontributory except as noted HPI/below      Objective:     BP 132/86 (BP Location: Left Arm, Patient Position: Sitting, Cuff Size: Normal)   Pulse 82   Temp (!) 97.2 F (36.2 C) (Temporal)   Ht 5' 8 (1.727 m)   Wt 188 lb 2 oz (85.3 kg)   LMP 10/31/2018   SpO2 98%   BMI 28.60 kg/m  Wt Readings from Last 3 Encounters:  04/17/24 188 lb 2 oz (85.3 kg)  01/10/24 199 lb (90.3 kg)  10/05/23 216 lb 8 oz (98.2 kg)    Physical Exam GENERAL: Well developed well nourished no acute distress HEAD EYES EARS NOSE THROAT: Normocephalic atraumatic, conjunctiva not injected, sclera nonicteric CARDIAC: Regular rate and rhythm, S1 S2 present , no murmur, dorsalis pedis 2 plus bilaterally NECK: Supple, no thyromegaly, no nodes, no carotid bruits LUNGS: Clear to auscultation bilaterally, no wheezes ABDOMEN: Bowel sounds present, soft, non tender non distended, no hepatosplenomegaly, no masses EXTREMITIES: No edema MUSCULOSKELETAL: No gross abnormalities NEUROLOGICAL: Alert and oriented x3, cranial nerves II through XII intact PSYCHIATRIC: Normal mood, good eye contact   Discussed labs    Assessment & Plan:  Type  2 diabetes mellitus without complication, without long-term current use of insulin (HCC) -     Lipid panel; Future -     Comprehensive metabolic panel with GFR; Future -     CBC with Differential/Platelet; Future -     Hemoglobin A1c; Future -     TSH; Future -     Microalbumin / creatinine urine ratio; Future  Vitamin D  deficiency -     Magnesium; Future -     VITAMIN D  25 Hydroxy (Vit-D Deficiency, Fractures); Future  B12 deficiency -     Vitamin B12; Future  Long term current use of oral hypoglycemic drug  Long-term current use  of injectable noninsulin antidiabetic medication  Other orders -     Ondansetron ; Take 1 tablet (4 mg total) by mouth every 8 (eight) hours as needed for nausea or vomiting.  Dispense: 20 tablet; Refill: 1    Assessment and Plan Assessment & Plan Type 2 diabetes mellitus   Her diabetes is well-controlled with an A1c of 5.8%. She has lost 29 pounds since July 8th, 2025, due to Mounjaro  and metformin . Occasional nausea may be linked to Mounjaro  or relative hypoglycemia. Continue Mounjaro  5 mg weekly and metformin  1000 mg once daily. Encourage regular exercise to maintain muscle mass and improve overall health. Consider Zofran  for nausea if needed. Retest cholesterol in three months to assess the impact of weight loss.  Depressive disorder   She experiences symptoms of feeling down, hopeless, and low energy. Regular exercise is encouraged to improve mood and energy levels by increasing endorphins. Continue current medications for depression.  Vitamin D  deficiency   This is managed with supplementation. Ensure adequate dietary calcium  intake and continue vitamin D  supplementation.  B vitamin deficiency   Suspected due to symptoms of hair loss and low energy. Start B complex vitamin supplementation to address these symptoms.  Fatty liver disease   Managed with lifestyle changes, including weight loss and medication. Mounjaro  is contributing to improved liver function and preventing progression. Continue current management with Mounjaro  and lifestyle modifications.  Hair loss secondary to rapid weight loss and menopause   Hair loss is likely due to rapid weight loss and menopause. Ensure adequate protein intake and start B complex vitamin supplementation to support hair health.     Return in about 3 months (around 07/16/2024) for DM-and labs 1 wk prior.  Jenkins CHRISTELLA Carrel, MD     [1]  Current Outpatient Medications:    Cholecalciferol (VITAMIN D -1000 MAX ST) 25 MCG (1000 UT) tablet, Take by  mouth., Disp: , Rfl:    desvenlafaxine (PRISTIQ) 50 MG 24 hr tablet, Take 50 mg by mouth daily., Disp: , Rfl:    estradiol  (VIVELLE -DOT) 0.075 MG/24HR, Apply 1 patch twice a week by transdermal route., Disp: , Rfl:    metFORMIN  (GLUCOPHAGE -XR) 500 MG 24 hr tablet, Take 3 tablets (1,500 mg total) by mouth daily with breakfast., Disp: 270 tablet, Rfl: 1   metroNIDAZOLE (METROGEL) 1 % gel, Apply topically as needed., Disp: , Rfl:    ondansetron  (ZOFRAN -ODT) 4 MG disintegrating tablet, Take 1 tablet (4 mg total) by mouth every 8 (eight) hours as needed for nausea or vomiting., Disp: 20 tablet, Rfl: 1   progesterone (PROMETRIUM) 100 MG capsule, Take 2 capsules every day by oral route., Disp: , Rfl:    tirzepatide  (MOUNJARO ) 5 MG/0.5ML Pen, Inject 5 mg into the skin once a week., Disp: 6 mL, Rfl: 1 [2] No Known Allergies  "

## 2024-04-17 NOTE — Patient Instructions (Addendum)
 It was very nice to see you today!  B complex vitamins  Get exercise   PLEASE NOTE:  If you had any lab tests please let us  know if you have not heard back within a few days. You may see your results on MyChart before we have a chance to review them but we will give you a call once they are reviewed by us . If we ordered any referrals today, please let us  know if you have not heard from their office within the next week.   Please try these tips to maintain a healthy lifestyle:  Eat most of your calories during the day when you are active. Eliminate processed foods including packaged sweets (pies, cakes, cookies), reduce intake of potatoes, white bread, white pasta, and white rice. Look for whole grain options, oat flour or almond flour.  Each meal should contain half fruits/vegetables, one quarter protein, and one quarter carbs (no bigger than a computer mouse).  Cut down on sweet beverages. This includes juice, soda, and sweet tea. Also watch fruit intake, though this is a healthier sweet option, it still contains natural sugar! Limit to 3 servings daily.  Drink at least 1 glass of water with each meal and aim for at least 8 glasses per day  Exercise at least 150 minutes every week.

## 2024-07-11 ENCOUNTER — Other Ambulatory Visit

## 2024-07-18 ENCOUNTER — Ambulatory Visit: Admitting: Family Medicine
